# Patient Record
Sex: Female | Born: 1985 | Hispanic: Yes | Marital: Single | Smoking: Former smoker
Health system: Southern US, Community
[De-identification: ages and names within clinical notes are randomized; demographics above are authoritative.]

## PROBLEM LIST (undated history)

## (undated) ENCOUNTER — Inpatient Hospital Stay (HOSPITAL_COMMUNITY): Payer: Self-pay

## (undated) DIAGNOSIS — D219 Benign neoplasm of connective and other soft tissue, unspecified: Secondary | ICD-10-CM

## (undated) DIAGNOSIS — R87629 Unspecified abnormal cytological findings in specimens from vagina: Secondary | ICD-10-CM

## (undated) DIAGNOSIS — N871 Moderate cervical dysplasia: Secondary | ICD-10-CM

## (undated) HISTORY — PX: LEEP: SHX91

## (undated) HISTORY — PX: COLPOSCOPY: SHX161

---

## 2015-05-21 ENCOUNTER — Encounter (HOSPITAL_COMMUNITY): Payer: Self-pay | Admitting: Emergency Medicine

## 2015-05-21 ENCOUNTER — Emergency Department (HOSPITAL_COMMUNITY)
Admission: EM | Admit: 2015-05-21 | Discharge: 2015-05-21 | Disposition: A | Discharge status: Home / Self Care | Payer: Self-pay | Attending: Emergency Medicine | Admitting: Emergency Medicine

## 2015-05-21 DIAGNOSIS — R1013 Epigastric pain: Secondary | ICD-10-CM

## 2015-05-21 DIAGNOSIS — K2970 Gastritis, unspecified, without bleeding: Secondary | ICD-10-CM

## 2015-05-21 MED ORDER — GI COCKTAIL ~~LOC~~
30.0000 mL | Freq: Once | ORAL | Status: AC
  Administered 2015-05-21: 30 mL via ORAL
  Filled 2015-05-21: qty 30

## 2015-05-21 MED ORDER — OMEPRAZOLE 20 MG PO CPDR: 20.0000 mg | DELAYED_RELEASE_CAPSULE | Freq: Every day | ORAL | Status: DC

## 2015-05-21 NOTE — Discharge Instructions (Signed)
Gastritis - Adultos  °(Gastritis, Adult) ° La gastrittis es la irritación (inflamación) de la membrana interna del estómago. Puede ser una enfermedad de inicio súbito (aguda) o de largo plazo (crónica). Si la gastritis no se trata, puede causar sangrado y úlceras. °CAUSAS  °La gastritis se produce cuando la membrana que tapiza interiormente al estómago se debilita o se daña. Los jugos digestivos del estómago inflaman el revestimiento del estómago debilitado. El revestimiento del estómago puede debilitarse o dañarse por una infección viral o bacteriana. La infección bacteriana más común es la infección por Helicobacter pylori. También puede ser el resultado del consumo excesivo de alcohol, por el uso de ciertos medicamentos o porque hay demasiado ácido en el estómago.  °SÍNTOMAS  °En algunos casos no hay síntomas. Si se presentan síntomas, éstos pueden ser:  °· Dolor o sensación de ardor en la parte superior del abdomen. °· Náuseas. °· Vómitos. °· Sensación molesta de distensión después de comer. °DIAGNÓSTICO  °El médico puede diagnosticar gastritis según los síntomas y el examen físico. Para determinar la causa de la gastritis, el médico podrá:  °· Pedir análisis de sangre o de materia fecal para diagnosticar la presencia de la bacteria H pylori. °· Gastroscopía. Un tubo delgado y flexible (endoscopio) se pasa por el esófago hasta llegar al estómago. El endoscopio tiene una luz y una cámara en el extremo. El médico utilizará el endoscopio para observar el interior del estómago. °· Tomará una muestra de tejido (biopsia) del estómago para examinarlo en el microscopio. °TRATAMIENTO  °Según la causa de la gastritis podrán recetarle: Antibióticos, si la causa es una infección bacteriana, como una infección por H. pylori. Antiácidos o bloqueadores H2, si hay demasiado ácido en el estómago. El médico le aconsejará que deje de tomar aspirina, ibuprofeno u otros antiinflamatorios no esteroides (AINE).  °INSTRUCCIONES PARA EL  CUIDADO EN EL HOGAR  °· Tome sólo medicamentos de venta libre o recetados, según las indicaciones del médico. °· Si le han recetado antibióticos, tómelos según las indicaciones. Tómelos todos, aunque se sienta mejor. °· Debe ingerir gran cantidad de líquido para mantener la orina de tono claro o color amarillo pálido. °· Evite las comidas y bebidas que empeoran los problemas, como: °¨ Bebidas con cafeína o alcohólicas. °¨ Chocolate. °¨ Sabores a menta. °¨ Ajo y cebolla. °¨ Comidas muy condimentadas. °¨ Cítricos como naranjas, limones o limas. °¨ Alimentos que contengan tomate, como salsas, chile y pizza. °¨ Alimentos fritos y grasos. °· Haga comidas pequeñas durante el día en lugar de 3 comidas abundantes. °SOLICITE ATENCIÓN MÉDICA DE INMEDIATO SI:  °· La materia fecal es negra o de color rojo oscuro. °· Vomita sangre de color rojo brillante o material similar a granos de café. °· No puede retener los líquidos. °· El dolor abdominal empeora. °· Tiene fiebre. °· No mejora luego de 1 semana. °· Tiene preguntas o preocupaciones. °ASEGÚRESE DE QUE:  °· Comprende estas instrucciones. °· Controlará su enfermedad. °· Solicitará ayuda de inmediato si no mejora o si empeora. °Document Released: 05/27/2005 Document Revised: 05/11/2012 °ExitCare® Patient Information ©2015 ExitCare, LLC. This information is not intended to replace advice given to you by your health care provider. Make sure you discuss any questions you have with your health care provider. ° °

## 2015-05-21 NOTE — ED Notes (Signed)
Pt arrived to the ED with a complaint of abdominal pain. Pain is located in the medical upper abdomen with corresponding pain in her back  Pt pain has been present for a day.

## 2015-05-29 NOTE — ED Provider Notes (Signed)
CSN: [id number]     Arrival date & time 05/21/15  0104 History   First MD Initiated Contact with Patient 05/21/15 0248     Chief Complaint  Patient presents with  . Abdominal Pain     (Consider location/radiation/quality/duration/timing/severity/associated sxs/prior Treatment) Patient is a 29 y.o. female presenting with abdominal pain. The history is provided by the patient. No language interpreter was used.  Abdominal Pain Pain location:  Epigastric Pain quality: sharp   Pain radiates to:  Back Pain severity:  Moderate Onset quality:  Gradual Timing:  Intermittent Progression:  Worsening Associated symptoms: no chest pain, no chills, no fever, no nausea, no shortness of breath and no vomiting   Associated symptoms comment:  Pain in epigastrium since earlier yesterday. She denies any association with having eaten, has no fever, vomiting or change in bowel habits. No cough, chest pain or SOB. She has tried TUMs without relief.    No past medical history on file. No past surgical history on file. No family history on file. Social History  Substance Use Topics  . Smoking status: Never Smoker   . Smokeless tobacco: Not on file  . Alcohol Use: Yes   OB History    No data available     Review of Systems  Constitutional: Negative for fever and chills.  Respiratory: Negative.  Negative for shortness of breath.   Cardiovascular: Negative.  Negative for chest pain.  Gastrointestinal: Positive for abdominal pain. Negative for nausea and vomiting.  Genitourinary: Negative.   Musculoskeletal: Positive for back pain.  Skin: Negative.  Negative for rash.  Neurological: Negative.       Allergies  Review of patient's allergies indicates no known allergies.  Home Medications   Prior to Admission medications   Medication Sig Start Date End Date Taking? Authorizing Provider  ibuprofen (ADVIL,MOTRIN) 200 MG tablet Take 400 mg by mouth every 6 (six) hours as needed (for pain.).    Yes Historical Provider, MD  omeprazole (PRILOSEC) 20 MG capsule Take 1 capsule (20 mg total) by mouth daily. 05/21/15   Shari Upstill, PA-C   BP 128/80 mmHg  Pulse 84  Temp(Src) 98.9 F (37.2 C) (Oral)  Resp 18  SpO2 100%  LMP 05/07/2015 (Approximate) Physical Exam  Constitutional: She is oriented to person, place, and time. She appears well-developed and well-nourished. No distress.  HENT:  Head: Normocephalic.  Pulmonary/Chest: Effort normal. She has no wheezes. She has no rales.  Abdominal: Soft. Bowel sounds are normal. She exhibits no distension. There is tenderness. There is no rebound and no guarding.  Tenderness to epigastric abdomen.   Musculoskeletal: Normal range of motion.  Neurological: She is alert and oriented to person, place, and time.  Skin: Skin is warm and dry.  Psychiatric: She has a normal mood and affect.    ED Course  Procedures (including critical care time) Labs Review Labs Reviewed - No data to display No results found for this or any previous visit.  Imaging Review No results found. I have personally reviewed and evaluated these images and lab results as part of my medical decision-making.   EKG Interpretation None      MDM   Final diagnoses:  Epigastric pain  Gastritis    GI Cocktail provided with complete relief of pain. She has normal VS, no nausea/vomiting. Suspect pain related to gastritis after heavy alcohol use yesterday. Stable for discharge.     Charlann Lange, PA-C 05/29/15 0006  Jola Schmidt, MD 05/31/15 [phone]

## 2017-03-12 ENCOUNTER — Encounter (HOSPITAL_COMMUNITY): Payer: Self-pay | Admitting: *Deleted

## 2017-04-01 ENCOUNTER — Ambulatory Visit (HOSPITAL_COMMUNITY)
Admission: RE | Admit: 2017-04-01 | Discharge: 2017-04-01 | Disposition: A | Discharge status: Home / Self Care | Payer: Self-pay | Source: Ambulatory Visit | Attending: Obstetrics and Gynecology | Admitting: Obstetrics and Gynecology

## 2017-04-01 ENCOUNTER — Encounter (HOSPITAL_COMMUNITY): Payer: Self-pay

## 2017-04-01 VITALS — BP 125/86 | HR 79 | Temp 97.5°F | Ht 67.0 in | Wt 168.4 lb

## 2017-04-01 DIAGNOSIS — Z1239 Encounter for other screening for malignant neoplasm of breast: Secondary | ICD-10-CM

## 2017-04-01 DIAGNOSIS — R87612 Low grade squamous intraepithelial lesion on cytologic smear of cervix (LGSIL): Secondary | ICD-10-CM

## 2017-04-01 NOTE — Patient Instructions (Addendum)
Explained breast self awareness with [name]. Patient did not need a Pap smear today due to last Pap smear was 02/24/2017. Explained the colposcopy the recommended follow up for her abnormal Pap smear. Patient referred to the [facility] [place] [facility] for a colpscopy. Appointment scheduled for Wednesday, April 21, 2017 at 1020. Patient aware of appointment and will be there. Discussed smoking cessation with patient. Referred to the [facility] Quitline. Let patient know she will need a screening mammogram at age 62 unless clinically indicated prior. Tracie Walsh verbalized understanding.  Lucion Dilger, Arvil Chaco, RN 4:34 PM

## 2017-04-01 NOTE — Progress Notes (Signed)
Patient referred to [place] by the [facility] Department due to having an abnormal Pap smear on 02/24/2017 that a colposcopy is recommended for follow up.   Pap Smear: Pap smear not completed today. Last Pap smear was 02/24/2017 at the [facility] and [facility]. Patient referred to the [facility] [place] [facility] for a colpscopy. Appointment scheduled for Wednesday, April 21, 2017 at 1020. Per patient has no history of an abnormal Pap smear prior to the most recent Pap smear. Last Pap smear result is in EPIC.  Physical exam: Breasts Breasts symmetrical. No skin abnormalities bilateral breasts. No nipple retraction bilateral breasts. No nipple discharge bilateral breasts. No lymphadenopathy. No lumps palpated bilateral breasts. No complaints of pain or tenderness on exam. Screening mammogram recommended at age 18 unless clinically indicated prior.       Pelvic/Bimanual No Pap smear completed today since last Pap smear was 02/24/2017. Pap smear not indicated per BCCCP guidelines.  Smoking History: Patient stated she occasionally smokes hookah. Discussed smoking cessation with patient. Referred to the [facility] Quitline.  Patient Navigation: Patient education provided. Access to services provided for patient through [facility] program. Spanish interpreter provided.  Used Spanish interpreter [employer] from [place].

## 2017-04-01 NOTE — Addendum Note (Signed)
Encounter addended by: Loletta Parish, RN on: 04/01/2017  4:41 PM<BR>    Actions taken: Sign clinical note

## 2017-04-21 ENCOUNTER — Ambulatory Visit (INDEPENDENT_AMBULATORY_CARE_PROVIDER_SITE_OTHER): Payer: Self-pay | Admitting: Obstetrics & Gynecology

## 2017-04-21 ENCOUNTER — Encounter: Payer: Self-pay | Admitting: Obstetrics & Gynecology

## 2017-04-21 ENCOUNTER — Other Ambulatory Visit (HOSPITAL_COMMUNITY): Admission: RE | Admit: 2017-04-21 | Payer: Self-pay | Source: Ambulatory Visit | Admitting: Obstetrics & Gynecology

## 2017-04-21 ENCOUNTER — Other Ambulatory Visit (HOSPITAL_COMMUNITY)
Admission: RE | Admit: 2017-04-21 | Discharge: 2017-04-21 | Disposition: A | Discharge status: Home / Self Care | Payer: Self-pay | Source: Ambulatory Visit | Attending: Obstetrics & Gynecology | Admitting: Obstetrics & Gynecology

## 2017-04-21 VITALS — BP 123/82 | HR 86 | Ht 67.0 in | Wt 167.9 lb

## 2017-04-21 DIAGNOSIS — R87612 Low grade squamous intraepithelial lesion on cytologic smear of cervix (LGSIL): Secondary | ICD-10-CM

## 2017-04-21 DIAGNOSIS — N871 Moderate cervical dysplasia: Secondary | ICD-10-CM

## 2017-04-21 DIAGNOSIS — Z3202 Encounter for pregnancy test, result negative: Secondary | ICD-10-CM

## 2017-04-21 LAB — POCT PREGNANCY, URINE: PREG TEST UR: NEGATIVE

## 2017-04-21 NOTE — Patient Instructions (Signed)
Colposcopía - Cuidados posteriores  (Colposcopy, Care After)  Siga estas instrucciones durante las próximas semanas. Estas indicaciones le proporcionan información general acerca de cómo deberá cuidarse después del procedimiento. El médico también podrá darle instrucciones más específicas. El tratamiento se ha planificado de acuerdo a las prácticas médicas actuales, pero a veces se producen problemas. Comuníquese con el médico si tiene algún problema o tiene dudas después del procedimiento.  QUÉ ESPERAR DESPUÉS DEL PROCEDIMIENTO   Después del procedimiento, es típico tener las siguientes sensaciones:  · Cólicos. Generalmente se calman en algunos minutos.  · Dolor. Puede durar hasta dos días.  · Aturdimiento. Si esto le ocurre, recuéstese durante algunos minutos.  Podrá tener un sangrado leve o una secreción oscura que debe detenerse en algunos días. Durante este tiempo deberá usar un apósito sanitario.  INSTRUCCIONES PARA EL CUIDADO EN EL HOGAR  · Evite las relaciones sexuales, las duchas vaginales y el uso de tampones durante 3 días, o según lo que le indique su médico.  · Tome sólo medicamentos de venta libre o recetados, según las indicaciones del médico. No tome aspirina, ya que puede causar hemorragias.  · Si utiliza píldoras anticonceptivas, continúe tomándolas.  · No todos los resultados estarán disponibles durante su visita. En este caso, tenga otra entrevista con su médico para conocerlos. No suponga que es normal si no tiene noticias de su médico o del establecimiento de salud. Es importante el seguimiento de todos los resultados de los estudios.  · Siga los consejos de su médico con respecto a los medicamentos, actividades, visitas y [place] de control.  SOLICITE ATENCIÓN MÉDICA SI:  · Aparece una erupción cutánea.  · Tiene problemas con los medicamentos.  SOLICITE ATENCIÓN MÉDICA DE INMEDIATO SI:  · Tiene una hemorragia abundante o elimina coágulos.  · Tiene fiebre.  · Tiene flujo vaginal  anormal.  · Tiene cólicos que no se alivian luego de tomar analgésicos.  · Se siente mareada, tiene vahídos o se desmaya.  · Siente dolor en el estómago.  Esta información no tiene como fin reemplazar el consejo del médico. Asegúrese de hacerle al médico cualquier pregunta que tenga.  Document Released: 06/07/2013  Elsevier Interactive Patient Education © 2017 Elsevier Inc.

## 2017-04-21 NOTE — Progress Notes (Signed)
States had unprotected intercourse 2 days ago, but husband did not ejaculate.

## 2017-05-04 ENCOUNTER — Telehealth: Payer: Self-pay | Admitting: General Practice

## 2017-05-04 NOTE — Telephone Encounter (Signed)
Called patient with pacific interpreter [phone], no answer- left message to call us back for results. Message sent to front office to schedule.

## 2017-05-04 NOTE — Telephone Encounter (Signed)
-----   Message from Woodroe Mode, MD sent at 04/26/2017  6:32 PM EDT ----- CIN 2 please schedule colposcopy

## 2017-05-05 NOTE — Telephone Encounter (Signed)
Called patient with pacific interpreter [phone] & informed her of results, need for LEEP, and that front office will contact her with LEEP appt. Patient verbalized understanding and asked if it will still be covered by BCCCP. Told patient yes they will. Patient verbalized understanding & had no other questions

## 2017-06-10 ENCOUNTER — Encounter: Payer: Self-pay | Admitting: Obstetrics & Gynecology

## 2017-06-10 ENCOUNTER — Ambulatory Visit (INDEPENDENT_AMBULATORY_CARE_PROVIDER_SITE_OTHER): Payer: Self-pay | Admitting: Obstetrics & Gynecology

## 2017-06-10 ENCOUNTER — Other Ambulatory Visit (HOSPITAL_COMMUNITY)
Admission: RE | Admit: 2017-06-10 | Discharge: 2017-06-10 | Disposition: A | Discharge status: Home / Self Care | Payer: Self-pay | Source: Ambulatory Visit | Attending: Obstetrics & Gynecology | Admitting: Obstetrics & Gynecology

## 2017-06-10 DIAGNOSIS — N871 Moderate cervical dysplasia: Secondary | ICD-10-CM

## 2017-06-10 HISTORY — DX: Moderate cervical dysplasia: N87.1

## 2017-06-10 LAB — POCT PREGNANCY, URINE: PREG TEST UR: NEGATIVE

## 2017-06-10 NOTE — Patient Instructions (Signed)
Conizacin del cuello del tero, cuidados posteriores (Conization of the Cervix, Care After) Siga estas instrucciones durante las prximas semanas. Estas indicaciones le proporcionan informacin acerca de cmo deber cuidarse despus del procedimiento. El mdico tambin podr darle instrucciones ms especficas. El tratamiento se ha planificado de acuerdo a las prcticas mdicas actuales, pero a veces se producen problemas. Comunquese con el mdico si tiene algn problema o tiene dudas despus del procedimiento. QU ESPERAR DESPUS DEL PROCEDIMIENTO Despus del procedimiento, es tpico tener las siguientes sensaciones:  Si le han administrado anestesia general se sentir mareada durante 2 o 3 horas despus del procedimiento.  Podr sentir clicos (similar a los Stage manager) durante aproximadamente 1 semana.  Puede tener sangrado o hemorragia vaginal leves o moderados durante 1 o 2semanas. El sangrado no debe ser abundante (por ejemplo, no debe empapar un apsito en menos de 1 hora).  Podr tener una secrecin vaginal oscura, similar a la borra del caf. Es la pasta que le han aplicado en el cuello del tero para controlar el sangrado. Esto es normal. La recuperacin puede demorar hasta 3 semanas. INSTRUCCIONES PARA EL CUIDADO EN EL HOGAR  Pdale a alguna persona que la lleve a su casa luego del procedimiento.  Solo tome los [employer] indic el mdico. No tome aspirina. Puede ocasionar hemorragias.  Durante la primera semana tome duchas. No tome baos, no practique natacin ni use el jacuzzi hasta que el mdico la autorice.  No se haga duchas vaginales, no utilice tampones ni tenga relaciones sexuales hasta que el mdico la autorice.  Evite las actividades extenuantes y la prctica de ejercicios durante al menos 7 a 14 das.  Podr volver a su dieta normal, excepto que el mdico le indique otra cosa.  Si est estreida, podr: ? Tomar un laxante suave segn las  indicaciones del mdico. ? Agregar frutas y salvado a su dieta. ? Debe ingerir gran cantidad de lquido para mantener la orina de tono claro o color amarillo plido.  Cumpla con todas las visitas de control con su mdico.  SOLICITE ATENCIN MDICA SI:  Le aparece una erupcin cutnea.  Se siente mareada o sufre un desmayo.  Siente nuseas.  Tiene una secrecin vaginal con mal olor.  SOLICITE ATENCIN MDICA DE INMEDIATO SI:  Observa cogulos sanguneos o una hemorragia ms abundante que un perodo menstrual normal (por ejemplo, si empapa un apsito en menos de 1 hora) o si la hemorragia es de color rojo brillante.  Tiene fiebre de ms de 101F (38,3C) o sntomas persistentes durante ms de 2 o 3das.  Tiene fiebre de ms de 101F (38,3C) y los sntomas empeoran repentinamente.  Siente cada vez ms clicos.  Se desmaya.  Siente dolor al [employer].  La orina tiene [place].  Comienza a vomitar.  El dolor no se alivia con los [employer].  El dolor es intenso o [place].  ASEGRESE DE QUE:  Comprende estas instrucciones.  Controlar su afeccin.  Recibir ayuda de inmediato si no mejora o si empeora.  Esta informacin no tiene Marine scientist el consejo del mdico. Asegrese de hacerle al mdico cualquier pregunta que tenga. Document Released: 04/19/2013 Document Revised: 08/22/2013 Document Reviewed: 02/10/2013 Elsevier Interactive Patient Education  2017 [employer].

## 2017-06-10 NOTE — Progress Notes (Signed)
Patient identified, informed consent obtained, signed copy in chart, time out performed.  Pap smear and colposcopy reviewed.   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  Cervix visualized. Paracervical block placed.  small size Fischer loop used to remove cone of cervix using blend of cut and cautery on LEEP machine.  Edges/Base cauterized with Ball.  Monsel's solution used for hemostasis.  Patient tolerated procedure well.  Patient given post procedure instructions.  Follow up in 12 months for repeat pap or as needed.  Woodroe Mode, MD 06/10/2017

## 2017-10-04 ENCOUNTER — Encounter (HOSPITAL_COMMUNITY): Payer: Self-pay

## 2017-10-04 ENCOUNTER — Encounter (HOSPITAL_COMMUNITY): Payer: Self-pay | Admitting: *Deleted

## 2017-10-04 LAB — OB RESULTS CONSOLE GC/CHLAMYDIA
Chlamydia: NEGATIVE
Gonorrhea: NEGATIVE

## 2017-10-04 LAB — OB RESULTS CONSOLE HEPATITIS B SURFACE ANTIGEN: Hepatitis B Surface Ag: NEGATIVE

## 2017-10-04 LAB — OB RESULTS CONSOLE ABO/RH: RH Type: POSITIVE

## 2017-10-04 LAB — OB RESULTS CONSOLE ANTIBODY SCREEN: Antibody Screen: NEGATIVE

## 2017-10-04 LAB — OB RESULTS CONSOLE RPR: RPR: NONREACTIVE

## 2017-10-04 LAB — OB RESULTS CONSOLE HIV ANTIBODY (ROUTINE TESTING): HIV: NONREACTIVE

## 2017-10-04 LAB — OB RESULTS CONSOLE RUBELLA ANTIBODY, IGM: RUBELLA: IMMUNE

## 2017-10-06 ENCOUNTER — Ambulatory Visit (HOSPITAL_COMMUNITY): Payer: Self-pay | Attending: Nurse Practitioner

## 2017-10-06 ENCOUNTER — Encounter (HOSPITAL_COMMUNITY): Payer: Self-pay

## 2017-10-08 ENCOUNTER — Inpatient Hospital Stay (HOSPITAL_COMMUNITY)
Admission: AD | Admit: 2017-10-08 | Discharge: 2017-10-08 | Disposition: A | Discharge status: Home / Self Care | Payer: Self-pay | Source: Ambulatory Visit | Attending: Obstetrics & Gynecology | Admitting: Obstetrics & Gynecology

## 2017-10-08 ENCOUNTER — Encounter (HOSPITAL_COMMUNITY): Payer: Self-pay | Admitting: *Deleted

## 2017-10-08 DIAGNOSIS — D259 Leiomyoma of uterus, unspecified: Secondary | ICD-10-CM

## 2017-10-08 DIAGNOSIS — O98812 Other maternal infectious and parasitic diseases complicating pregnancy, second trimester: Secondary | ICD-10-CM | POA: Insufficient documentation

## 2017-10-08 DIAGNOSIS — R109 Unspecified abdominal pain: Secondary | ICD-10-CM | POA: Insufficient documentation

## 2017-10-08 DIAGNOSIS — Z3A14 14 weeks gestation of pregnancy: Secondary | ICD-10-CM | POA: Insufficient documentation

## 2017-10-08 DIAGNOSIS — O26899 Other specified pregnancy related conditions, unspecified trimester: Secondary | ICD-10-CM

## 2017-10-08 DIAGNOSIS — B373 Candidiasis of vulva and vagina: Secondary | ICD-10-CM | POA: Insufficient documentation

## 2017-10-08 DIAGNOSIS — O26892 Other specified pregnancy related conditions, second trimester: Secondary | ICD-10-CM | POA: Insufficient documentation

## 2017-10-08 DIAGNOSIS — O98819 Other maternal infectious and parasitic diseases complicating pregnancy, unspecified trimester: Secondary | ICD-10-CM

## 2017-10-08 DIAGNOSIS — B3731 Acute candidiasis of vulva and vagina: Secondary | ICD-10-CM

## 2017-10-08 DIAGNOSIS — O3412 Maternal care for benign tumor of corpus uteri, second trimester: Secondary | ICD-10-CM | POA: Insufficient documentation

## 2017-10-08 LAB — URINALYSIS, ROUTINE W REFLEX MICROSCOPIC
BILIRUBIN URINE: NEGATIVE
Glucose, UA: NEGATIVE mg/dL
HGB URINE DIPSTICK: NEGATIVE
KETONES UR: NEGATIVE mg/dL
NITRITE: NEGATIVE
PROTEIN: NEGATIVE mg/dL
Specific Gravity, Urine: 1.012 (ref 1.005–1.030)
pH: 7 (ref 5.0–8.0)

## 2017-10-08 LAB — CBC WITH DIFFERENTIAL/PLATELET
BASOS ABS: 0 10*3/uL (ref 0.0–0.1)
Basophils Relative: 0 %
Eosinophils Absolute: 0.2 10*3/uL (ref 0.0–0.7)
Eosinophils Relative: 2 %
HEMATOCRIT: 36 % (ref 36.0–46.0)
Hemoglobin: 12.3 g/dL (ref 12.0–15.0)
LYMPHS ABS: 1.7 10*3/uL (ref 0.7–4.0)
LYMPHS PCT: 15 %
MCH: 31.1 pg (ref 26.0–34.0)
MCHC: 34.2 g/dL (ref 30.0–36.0)
MCV: 91.1 fL (ref 78.0–100.0)
MONOS PCT: 8 %
Monocytes Absolute: 0.8 10*3/uL (ref 0.1–1.0)
NEUTROS ABS: 8.2 10*3/uL — AB (ref 1.7–7.7)
Neutrophils Relative %: 75 %
Platelets: 272 10*3/uL (ref 150–400)
RBC: 3.95 MIL/uL (ref 3.87–5.11)
RDW: 11.7 % (ref 11.5–15.5)
WBC: 10.8 10*3/uL — ABNORMAL HIGH (ref 4.0–10.5)

## 2017-10-08 LAB — WET PREP, GENITAL
Clue Cells Wet Prep HPF POC: NONE SEEN
Sperm: NONE SEEN
Trich, Wet Prep: NONE SEEN

## 2017-10-08 MED ORDER — CYCLOBENZAPRINE HCL 5 MG PO TABS: 5.0000 mg | ORAL_TABLET | Freq: Three times a day (TID) | ORAL | 0 refills | Status: AC | PRN

## 2017-10-08 MED ORDER — OXYCODONE-ACETAMINOPHEN 5-325 MG PO TABS
1.0000 | ORAL_TABLET | Freq: Once | ORAL | Status: AC
  Administered 2017-10-08: 1 via ORAL
  Filled 2017-10-08: qty 1

## 2017-10-08 MED ORDER — GLYCOPYRROLATE 1 MG PO TABS: 1.0000 mg | ORAL_TABLET | Freq: Three times a day (TID) | ORAL | 0 refills | Status: DC

## 2017-10-08 MED ORDER — TERCONAZOLE 0.4 % VA CREA: 1.0000 | TOPICAL_CREAM | Freq: Every day | VAGINAL | 0 refills | Status: DC

## 2017-10-08 NOTE — Discharge Instructions (Signed)
Las medicinas seguras para tomar Solicitor  Safe Medications in Pregnancy  Acn:  Benzoyl Peroxide (Perxido de benzolo)  Salicylic Acid (cido saliclico)  Dolor de espalda/Dolor de cabeza:  Tylenol: 2 pastillas de concentracin regular cada 4 horas O 2 pastillas de concentracin fuerte cada 6 horas  Resfriados/Tos/Alergias:  Benadryl (sin alcohol) 25 mg cada 6 horas segn lo necesite Breath Right strips (Tiras para respirar correctamente)  Claritin  Cepacol (pastillas de chupar para la garganta)  Chloraseptic (aerosol para la garganta)  Cold-Eeze- hasta tres veces por da  Cough drops (pastillas de chupar para la tos, sin alcohol)  Flonase (con receta mdica solamente)  Guaifenesin  Mucinex  Robitussin DM (simple solamente, sin alcohol)  Saline nasal spray/drops (Aerosol nasal salino/gotas) Sudafed (pseudoephedrine) y  Actifed * utilizar slo despus de 12 semanas de gestacin y si no tiene la presin arterial alta.  Tylenol Vicks  VapoRub  Zinc lozenges (pastillas para la garganta)  Zyrtec  Estreimiento:  Colace  Ducolax (supositorios)  Fleet enema (lavado intestinal rectal)  Glycerin (supositorios)  Metamucil  Milk of magnesia (leche de magnesia)  Miralax  Senokot  Smooth Move (t)  Diarrea:  Kaopectate Imodium A-D  *NO tome Pepto-Bismol  Hemorroides:  Anusol  Anusol HC  Preparation H  Tucks  Indigestin:  Tums  Maalox  Mylanta  Zantac  Pepcid  Insomnia:  Benadryl (sin alcohol) 25mg  cada 6 horas segn lo necesite  Tylenol PM  Unisom, no Gelcaps  [place] en las piernas:  Tums  MagGel Nuseas/Vmitos:  Bonine  Dramamine  Emetrol  Ginger (extracto)  Sea-Bands  Meclizine  Medicina para las nuseas que puede tomar durante el embarazo: Unisom (doxylamine succinate, pastillas de 25 mg) Tome una pastilla al da al [place]. Si los sntomas no estn adecuadamente controlados, la dosis puede aumentarse hasta una dosis mxima recomendada de [employer] al da (1/2 pastilla por la [place], 1/2 pastilla a media tarde y Ardelia Mems pastilla al [place]). Pastillas de Vitamina B6 de 100mg . Tome [employer] veces al da (hasta 200 mg por da).  Erupciones en la piel:  Productos de Aveeno  Benadryl cream (crema o una dosis de 25mg  cada 6 horas segn lo necesite)  Calamine Lotion (locin)  1% cortisone cream (crema de cortisona de 1%)  nfeccin vaginal por hongos (candidiasis):  Gyne-lotrimin 7  Monistat 7   **Si est tomando varias medicinas, por favor revise las etiquetas para Conservation officer, nature los mismos ingredientes [place]. **Tome la medicina segn lo indicado en la etiqueta. **No tome ms de 400 mg de Tylenol en 24 horas. **No tome medicinas que contengan aspirina o ibuprofeno.       Fibromas uterinos (Uterine Fibroids) Los fibromas uterinos son masas (tumores) de tejido que pueden desarrollarse en el vientre (tero). Tambin se los [employer]. Este tipo de tumor no es Radio broadcast assistant (benigno) y no se disemina a Airline pilot del cuerpo fuera de la zona plvica, la cual se encuentra entre los huesos de la cadera. En ocasiones, los fibromas pueden crecer en las trompas de [place], en el cuello del tero o en las estructuras de soporte (ligamentos) que rodean el tero. Una mujer puede tener uno o ms fibromas. Los fibromas pueden tener diferente tamao y [place], y crecer en distintas partes del tero. Algunos pueden crecer hasta volverse bastante grandes. La mayora no requiere tratamiento mdico. CAUSAS Un fibroma puede desarrollarse cuando una nica clula uterina contina creciendo (se multiplica). La mayora de las clulas del cuerpo [place]  tienen un mecanismo de control que impide que se multipliquen sin control. [place] los sntomas se pueden incluir los siguientes:  Hemorragias intensas durante la menstruacin.  Prdidas de sangre o [employer].  Dolor y opresin en la pelvis.  Problemas  de la vejiga, como necesidad de Garment/textile technologist con ms frecuencia (polaquiuria) o necesidad imperiosa de Garment/textile technologist.  Incapacidad para reproducir (infertilidad).  Abortos espontneos. DIAGNSTICO Los fibromas uterinos se diagnostican con un examen fsico. El mdico puede palpar los tumores grumosos durante un examen plvico. Pueden realizarse ecografas y Ardelia Mems resonancia magntica para determinar el tamao y la ubicacin de los fibromas, as como la cantidad. TRATAMIENTO El tratamiento puede incluir lo siguiente:  Observacin cautelosa. Esto requiere que el mdico controle el fibroma para saber si crece o se achica. Siga las recomendaciones del mdico respecto de la frecuencia con la que debe realizarse los controles.  Medicamentos hormonales. Pueden tomarse por va oral o administrarse a travs de un dispositivo intrauterino (DIU).  [place]. ? Extirpacin de los fibromas (miomectoma) o del tero (histerectoma). ? Suprimir la irrigacin sangunea a los fibromas (embolizacin de la arteria uterina). Si los fibromas le traen problemas de fertilidad y tiene deseos de quedar [place], el mdico puede recomendar su extirpacin. INSTRUCCIONES PARA EL CUIDADO EN EL HOGAR  Concurra a todas las visitas de control como se lo haya indicado el mdico. Esto es importante.  Tome los medicamentos de venta libre y los recetados solamente como se lo haya indicado el mdico. ? Si le recetaron un tratamiento hormonal, tome los medicamentos hormonales exactamente como se lo indicaron.  Consulte al [employer] sobre tomar comprimidos de hierro y Garment/textile technologist la cantidad de verduras de hoja color verde oscuro en la dieta. Estas medidas pueden ayudar a Transport planner de hierro en la [place], que pueden verse afectados por las hemorragias menstruales intensas.  Preste mucha atencin a la menstruacin e informe al mdico si hay algn cambio, por ejemplo: ? Aumento del flujo de sangre que le exige el uso de ms compresas o  tampones que los que utiliza normalmente cada mes. ? Un cambio en la cantidad de [employer] dura la menstruacin cada mes. ? Un cambio en los sntomas asociados con la [place], como clicos abdominales o dolor de espalda. SOLICITE ATENCIN MDICA SI:  Tiene dolor plvico, dolor de espalda o clicos abdominales que los medicamentos no Engineer, petroleum.  Observa un aumento del sangrado entre y [employer].  Empapa los tampones o las compresas en el trmino de media hora o [place].  Se siente mareada, muy cansada o dbil. SOLICITE ATENCIN MDICA DE INMEDIATO SI:  Se desmaya.  El dolor plvico aumenta repentinamente. Esta informacin no tiene Marine scientist el consejo del mdico. Asegrese de hacerle al mdico cualquier pregunta que tenga. Document Released: 08/17/2005 Document Revised: 12/09/2015 Document Reviewed: 02/13/2014 Elsevier Interactive Patient Education  [employer].

## 2017-10-08 NOTE — MAU Note (Addendum)
PT SAYS WITH INTERPRETER- JANET-    [place]   AT HD .   STARTED HAVING LOWER ABD PAIN YESTERDAY .- HURTS TO WALK.       LAST SEX-  LAST  WED.  NO MEDS FOR PAIN

## 2017-10-08 NOTE — MAU Provider Note (Signed)
History     CSN: [id number]  Arrival date and time: 10/08/17 9509   First Provider Initiated Contact with Patient 10/08/17 0125     Chief Complaint  Patient presents with  . Abdominal Pain   HPI Tracie Walsh is a 32 y.o. G2P1001 at [redacted]w[redacted]d who presents with abdominal pain. She states the pain started at 2000 on 2/6 that got worse in the morning. She states the pain continued to get worse throughout the day. She rates the pain an 8/10 and has not tried anything for the pain. She states she had an ultrasound on Monday that showed 3 fibroids in the top of her uterus. She denies any leaking or bleeding. She gets prenatal care at [facility].   OB History    Gravida Para Term Preterm AB Living   2 1 1     1    SAB TAB Ectopic Multiple Live Births           1      History reviewed. No pertinent past medical history.  Past Surgical History:  Procedure Laterality Date  . CESAREAN SECTION    . LEEPS      Family History  Problem Relation Age of Onset  . Heart disease Father   . Hypercholesterolemia Father     Social History   Tobacco Use  . Smoking status: Current Some Day Smoker  . Smokeless tobacco: Never Used  . Tobacco comment: smokes hookah occasionally  Substance Use Topics  . Alcohol use: Yes    Comment: socially   . Drug use: No    Allergies: No Known Allergies  Medications Prior to Admission  Medication Sig Dispense Refill Last Dose  . Prenatal Vit-Fe Fumarate-FA (PRENATAL MULTIVITAMIN) TABS tablet Take 1 tablet by mouth daily at 12 noon.   10/07/2017 at Unknown time  . ibuprofen (ADVIL,MOTRIN) 200 MG tablet Take 400 mg by mouth every 6 (six) hours as needed (for pain.).   Not Taking    Review of Systems  Constitutional: Negative.  Negative for fatigue and fever.  HENT: Negative.   Respiratory: Negative.  Negative for shortness of breath.   Cardiovascular: Negative.  Negative for chest pain.  Gastrointestinal: Positive for abdominal pain. Negative for  constipation, diarrhea, nausea and vomiting.  Genitourinary: Negative.  Negative for dysuria, vaginal bleeding and vaginal discharge.  Neurological: Negative.  Negative for dizziness and headaches.   Physical Exam   Blood pressure 104/70, pulse (!) 109, temperature 98 F (36.7 C), temperature source Oral, resp. rate 18, height 5\' 7"  (1.702 m), weight 172 lb 8 oz (78.2 kg), last menstrual period 06/29/2017.  Physical Exam  Nursing note and vitals reviewed. Constitutional: She is oriented to person, place, and time. She appears well-developed and well-nourished. No distress.  HENT:  Head: Normocephalic.  Eyes: Pupils are equal, round, and reactive to light.  Cardiovascular: Normal rate, regular rhythm and normal heart sounds.  Respiratory: Effort normal and breath sounds normal. No respiratory distress.  GI: Soft. Bowel sounds are normal. She exhibits distension and mass. There is tenderness in the right lower quadrant, suprapubic area and left lower quadrant. There is guarding. There is no rebound.    Diffuse tenderness along lower abdomen   Genitourinary: Vaginal discharge (thick white adherent discharge ) found.  Neurological: She is alert and oriented to person, place, and time.  Skin: Skin is warm and dry.  Psychiatric: She has a normal mood and affect. Her behavior is normal. Judgment and thought content normal.  Pelvic exam: Cervix pink, visually closed, without lesion, scant white creamy discharge, vaginal walls and external genitalia normal Bimanual exam: Cervix 0/long/high, firm, anterior, neg CMT, uterus non tender  FHT: 159 bpm  MAU Course  Procedures Results for orders placed or performed during the hospital encounter of 10/08/17 (from the past 24 hour(s))  Urinalysis, Routine w reflex microscopic     Status: Abnormal   Collection Time: 10/08/17 12:54 AM  Result Value Ref Range   Color, Urine STRAW (A) YELLOW   APPearance CLEAR CLEAR   Specific Gravity, Urine 1.012  1.005 - 1.030   pH 7.0 5.0 - 8.0   Glucose, UA NEGATIVE NEGATIVE mg/dL   Hgb urine dipstick NEGATIVE NEGATIVE   Bilirubin Urine NEGATIVE NEGATIVE   Ketones, ur NEGATIVE NEGATIVE mg/dL   Protein, ur NEGATIVE NEGATIVE mg/dL   Nitrite NEGATIVE NEGATIVE   Leukocytes, UA MODERATE (A) NEGATIVE   RBC / HPF 0-5 0 - 5 RBC/hpf   WBC, UA 0-5 0 - 5 WBC/hpf   Bacteria, UA RARE (A) NONE SEEN   Squamous Epithelial / LPF 0-5 (A) NONE SEEN   Mucus PRESENT   Wet prep, genital     Status: Abnormal   Collection Time: 10/08/17  1:18 AM  Result Value Ref Range   Yeast Wet Prep HPF POC PRESENT (A) NONE SEEN   Trich, Wet Prep NONE SEEN NONE SEEN   Clue Cells Wet Prep HPF POC NONE SEEN NONE SEEN   WBC, Wet Prep HPF POC MODERATE (A) NONE SEEN   Sperm NONE SEEN   CBC with Differential/Platelet     Status: Abnormal   Collection Time: 10/08/17  2:08 AM  Result Value Ref Range   WBC 10.8 (H) 4.0 - 10.5 K/uL   RBC 3.95 3.87 - 5.11 MIL/uL   Hemoglobin 12.3 12.0 - 15.0 g/dL   HCT 36.0 36.0 - 46.0 %   MCV 91.1 78.0 - 100.0 fL   MCH 31.1 26.0 - 34.0 pg   MCHC 34.2 30.0 - 36.0 g/dL   RDW 11.7 11.5 - 15.5 %   Platelets 272 150 - 400 K/uL   Neutrophils Relative % 75 %   Neutro Abs 8.2 (H) 1.7 - 7.7 K/uL   Lymphocytes Relative 15 %   Lymphs Abs 1.7 0.7 - 4.0 K/uL   Monocytes Relative 8 %   Monocytes Absolute 0.8 0.1 - 1.0 K/uL   Eosinophils Relative 2 %   Eosinophils Absolute 0.2 0.0 - 0.7 K/uL   Basophils Relative 0 %   Basophils Absolute 0.0 0.0 - 0.1 K/uL     MDM UA, urine culture Wet prep and gc/chlamydia Percocet PO- patient reports relief from pain CBC with Diff  Low suspicion for appendicitis due to location of pain and absence of fever, leukocytosis and GI complaints.  Tenderness in abdomen resolved after pain medication  Assessment and Plan   1. Uterine fibroids affecting pregnancy in second trimester   2. Candidiasis of vagina during pregnancy   3. [redacted] weeks gestation of pregnancy   4.  Abdominal pain affecting pregnancy    -Discharge home in stable condition -Rx for terazol and flexeril given to patient -Round ligament pain precautions discussed -Encouraged patient to use maternity support belt -Patient advised to follow-up with [facility] as scheduled on Monday for prenatal care -Patient may return to MAU as needed or if her condition were to change or worsen  Wende Mott CNM 10/08/2017, 1:27 AM

## 2017-10-09 LAB — CULTURE, OB URINE: CULTURE: NO GROWTH

## 2017-10-11 LAB — GC/CHLAMYDIA PROBE AMP (~~LOC~~) NOT AT ARMC
CHLAMYDIA, DNA PROBE: NEGATIVE
NEISSERIA GONORRHEA: NEGATIVE

## 2017-11-03 ENCOUNTER — Inpatient Hospital Stay (HOSPITAL_COMMUNITY)
Admission: AD | Admit: 2017-11-03 | Discharge: 2017-11-04 | Disposition: A | Discharge status: Home / Self Care | Payer: Self-pay | Source: Ambulatory Visit | Attending: Obstetrics & Gynecology | Admitting: Obstetrics & Gynecology

## 2017-11-03 ENCOUNTER — Encounter (HOSPITAL_COMMUNITY): Payer: Self-pay

## 2017-11-03 DIAGNOSIS — D259 Leiomyoma of uterus, unspecified: Secondary | ICD-10-CM | POA: Insufficient documentation

## 2017-11-03 DIAGNOSIS — O3412 Maternal care for benign tumor of corpus uteri, second trimester: Secondary | ICD-10-CM | POA: Insufficient documentation

## 2017-11-03 DIAGNOSIS — O26899 Other specified pregnancy related conditions, unspecified trimester: Secondary | ICD-10-CM

## 2017-11-03 DIAGNOSIS — F17210 Nicotine dependence, cigarettes, uncomplicated: Secondary | ICD-10-CM | POA: Insufficient documentation

## 2017-11-03 DIAGNOSIS — O99332 Smoking (tobacco) complicating pregnancy, second trimester: Secondary | ICD-10-CM | POA: Insufficient documentation

## 2017-11-03 DIAGNOSIS — Z3A18 18 weeks gestation of pregnancy: Secondary | ICD-10-CM | POA: Insufficient documentation

## 2017-11-03 DIAGNOSIS — O2342 Unspecified infection of urinary tract in pregnancy, second trimester: Secondary | ICD-10-CM | POA: Insufficient documentation

## 2017-11-03 DIAGNOSIS — R102 Pelvic and perineal pain: Secondary | ICD-10-CM | POA: Insufficient documentation

## 2017-11-03 DIAGNOSIS — O26892 Other specified pregnancy related conditions, second trimester: Secondary | ICD-10-CM | POA: Insufficient documentation

## 2017-11-03 LAB — URINALYSIS, ROUTINE W REFLEX MICROSCOPIC
Bilirubin Urine: NEGATIVE
GLUCOSE, UA: NEGATIVE mg/dL
Hgb urine dipstick: NEGATIVE
KETONES UR: NEGATIVE mg/dL
Nitrite: NEGATIVE
PROTEIN: NEGATIVE mg/dL
Specific Gravity, Urine: 1.023 (ref 1.005–1.030)
pH: 6 (ref 5.0–8.0)

## 2017-11-03 MED ORDER — SULFAMETHOXAZOLE-TRIMETHOPRIM 800-160 MG PO TABS: 1.0000 | ORAL_TABLET | Freq: Two times a day (BID) | ORAL | 0 refills | Status: AC

## 2017-11-03 MED ORDER — OXYCODONE-ACETAMINOPHEN 5-325 MG PO TABS
1.0000 | ORAL_TABLET | Freq: Once | ORAL | Status: AC
  Administered 2017-11-03: 1 via ORAL
  Filled 2017-11-03: qty 1

## 2017-11-03 MED ORDER — OXYCODONE-ACETAMINOPHEN 5-325 MG PO TABS: 1.0000 | ORAL_TABLET | Freq: Four times a day (QID) | ORAL | 0 refills | Status: AC | PRN

## 2017-11-03 MED ORDER — SULFAMETHOXAZOLE-TRIMETHOPRIM 800-160 MG PO TABS
1.0000 | ORAL_TABLET | Freq: Two times a day (BID) | ORAL | Status: DC
  Administered 2017-11-03: 1 via ORAL
  Filled 2017-11-03: qty 1

## 2017-11-03 NOTE — Discharge Instructions (Signed)
°  Try a pregnancy support belt for your pain.

## 2017-11-03 NOTE — MAU Note (Signed)
Pt reports generalized abdominal pain that started yesterday. States it hurts more on right side. Pt states the pain is sharp and constant. States she has fibroids. She took tylenol pain and "pill for muscle pain" last night, but it did not help. States it hurts to walk and get in bed. States when she is hunched over it feels a little better but standing straight up hurts. Pt denies vaginal bleeding or discharge.

## 2017-11-03 NOTE — MAU Provider Note (Signed)
Chief Complaint: Abdominal Pain   First Provider Initiated Contact with Patient 11/03/17 2132      SUBJECTIVE HPI: Tracie Walsh is a 32 y.o. G2P1001 at [redacted]w[redacted]d by LMP who presents to maternity admissions reporting lower and mid abdominal pain that is constant, but increases with movement. The pain started 2 weeks ago but is much worse in the last 2 days. The pain is bilateral in her low abdomen and around the umbilical area. It is worse when getting out of bed or walking. It is better when leaning forward. She has associated back pain but thinks this is caused by walking leaned over to help her abdominal pain. She has tried Flexeril, Tylenol, and hot showers which have not helped.  She has known fibroids and wants to know if fibroids can cause this pain.  She was diagnosed with yeast infection 1 month ago but is unsure of how to use the Terazol 7 medication that was prescribed. She denies vaginal bleeding, vaginal itching/burning, urinary symptoms, h/a, dizziness, n/v, or fever/chills.    Hospital spanish interpreter used for all communication  HPI  History reviewed. No pertinent past medical history. Past Surgical History:  Procedure Laterality Date  . CESAREAN SECTION    . LEEPS     Social History   Socioeconomic History  . Marital status: Single    Spouse name: Not on file  . Number of children: Not on file  . Years of education: Not on file  . Highest education level: Not on file  Social Needs  . Financial resource strain: Not on file  . Food insecurity - worry: Not on file  . Food insecurity - inability: Not on file  . Transportation needs - medical: Not on file  . Transportation needs - non-medical: Not on file  Occupational History  . Not on file  Tobacco Use  . Smoking status: Current Some Day Smoker  . Smokeless tobacco: Never Used  . Tobacco comment: smokes hookah occasionally  Substance and Sexual Activity  . Alcohol use: Yes    Comment: socially   . Drug  use: No  . Sexual activity: Yes    Birth control/protection: None  Other Topics Concern  . Not on file  Social History Narrative  . Not on file   No current facility-administered medications on file prior to encounter.    Current Outpatient Medications on File Prior to Encounter  Medication Sig Dispense Refill  . cyclobenzaprine (FLEXERIL) 5 MG tablet Take 1 tablet (5 mg total) by mouth 3 (three) times daily as needed for muscle spasms. 15 tablet 0  . glycopyrrolate (ROBINUL) 1 MG tablet Take 1 tablet (1 mg total) by mouth 3 (three) times daily. 90 tablet 0  . Prenatal Vit-Fe Fumarate-FA (PRENATAL MULTIVITAMIN) TABS tablet Take 1 tablet by mouth daily at 12 noon.    Marland Kitchen terconazole (TERAZOL 7) 0.4 % vaginal cream Place 1 applicator vaginally at bedtime. 45 g 0   No Known Allergies  ROS:  Review of Systems  Constitutional: Negative for chills, fatigue and fever.  Respiratory: Negative for shortness of breath.   Cardiovascular: Negative for chest pain.  Gastrointestinal: Positive for abdominal pain.  Genitourinary: Positive for pelvic pain. Negative for difficulty urinating, dysuria, flank pain, vaginal bleeding, vaginal discharge and vaginal pain.  Musculoskeletal: Positive for back pain.  Neurological: Negative for dizziness and headaches.  Psychiatric/Behavioral: Negative.      I have reviewed patient's Past Medical Hx, Surgical Hx, Family Hx, Social Hx, medications and allergies.  Physical Exam   Patient Vitals for the past 24 hrs:  BP Temp Temp src Pulse Resp SpO2  11/03/17 2327 122/75 - - (!) 103 - -  11/03/17 2000 96/78 98.2 F (36.8 C) Oral (!) 106 18 100 %   Constitutional: Well-developed, well-nourished female in no acute distress.  Cardiovascular: normal rate Respiratory: normal effort GI: Abd soft, non-tender. Pos BS x 4 MS: Extremities nontender, no edema, normal ROM Neurologic: Alert and oriented x 4.  GU: Neg CVAT.   Dilation: Closed Effacement (%):  Thick Exam by:: Blanch Media CNM    Thick clumpy white/yellow discharge on glove following exam.  FHT 153 by doppler  LAB RESULTS Results for orders placed or performed during the hospital encounter of 11/03/17 (from the past 24 hour(s))  Urinalysis, Routine w reflex microscopic     Status: Abnormal   Collection Time: 11/03/17  7:55 PM  Result Value Ref Range   Color, Urine YELLOW YELLOW   APPearance CLEAR CLEAR   Specific Gravity, Urine 1.023 1.005 - 1.030   pH 6.0 5.0 - 8.0   Glucose, UA NEGATIVE NEGATIVE mg/dL   Hgb urine dipstick NEGATIVE NEGATIVE   Bilirubin Urine NEGATIVE NEGATIVE   Ketones, ur NEGATIVE NEGATIVE mg/dL   Protein, ur NEGATIVE NEGATIVE mg/dL   Nitrite NEGATIVE NEGATIVE   Leukocytes, UA LARGE (A) NEGATIVE   RBC / HPF 0-5 0 - 5 RBC/hpf   WBC, UA 0-5 0 - 5 WBC/hpf   Bacteria, UA RARE (A) NONE SEEN   Squamous Epithelial / LPF 0-5 (A) NONE SEEN   Mucus PRESENT        IMAGING No results found.  MAU Management/MDM: UA with large leukocytes, rare bacteria and limited squamous cells.  Possible UTI, will send for culture but given pt symptoms, will treat for presumptive UTI with Bactrim DS BID x 7 days, first dose tonight in MAU.   Pain that improves with knees to chest/leaning forward is c/w with round ligament pain.  Rest/ice/heat/warm bath/Tylenol/pregnancy support belt for pain.  Researched with pt low-cost options for pregnancy support belt.  Percocet 5/325 x 1 tablet given in MAU and Rx for Percocet 5/325, take 1-2 Q 6 hours PRN x 6 tabs.  Treat yeast with Terazol as prescribed, discussed how to insert medication with applicator tonight.  F/U in office as scheduled, return to MAU as needed for emergencies.  Pt discharged with strict preterm labor precautions.  ASSESSMENT 1. UTI (urinary tract infection) during pregnancy, second trimester   2. Pain of round ligament affecting pregnancy, antepartum   3. Uterine fibroids affecting pregnancy in second  trimester     PLAN Discharge home Allergies as of 11/03/2017   No Known Allergies     Medication List    TAKE these medications   cyclobenzaprine 5 MG tablet Commonly known as:  FLEXERIL Take 1 tablet (5 mg total) by mouth 3 (three) times daily as needed for muscle spasms.   glycopyrrolate 1 MG tablet Commonly known as:  ROBINUL Take 1 tablet (1 mg total) by mouth 3 (three) times daily.   oxyCODONE-acetaminophen 5-325 MG tablet Commonly known as:  PERCOCET/ROXICET Take 1-2 tablets by mouth every 6 (six) hours as needed for severe pain.   prenatal multivitamin Tabs tablet Take 1 tablet by mouth daily at 12 noon.   sulfamethoxazole-trimethoprim 800-160 MG tablet Commonly known as:  BACTRIM DS,SEPTRA DS Take 1 tablet by mouth 2 (two) times daily for 7 days.   terconazole 0.4 % vaginal  cream Commonly known as:  TERAZOL 7 Place 1 applicator vaginally at bedtime.      Follow-up Information    Department, [facility] Follow up.   Why:  As scheduled, return to MAU as needed for emergencies Contact information: [place] [address] [phone]           Lisa Leftwich-Kirby Certified Nurse-Midwife 11/03/2017  11:51 PM

## 2017-11-05 LAB — CULTURE, OB URINE

## 2018-02-08 ENCOUNTER — Other Ambulatory Visit: Payer: Self-pay | Admitting: Obstetrics and Gynecology

## 2018-02-08 DIAGNOSIS — Z3689 Encounter for other specified antenatal screening: Secondary | ICD-10-CM

## 2018-02-09 ENCOUNTER — Encounter (HOSPITAL_COMMUNITY): Payer: Self-pay

## 2018-02-17 ENCOUNTER — Other Ambulatory Visit: Payer: Self-pay | Admitting: Obstetrics and Gynecology

## 2018-02-17 ENCOUNTER — Encounter (HOSPITAL_COMMUNITY): Payer: Self-pay

## 2018-02-17 ENCOUNTER — Ambulatory Visit (HOSPITAL_COMMUNITY)
Admission: RE | Admit: 2018-02-17 | Discharge: 2018-02-17 | Disposition: A | Discharge status: Home / Self Care | Payer: Self-pay | Source: Ambulatory Visit | Attending: Obstetrics and Gynecology | Admitting: Obstetrics and Gynecology

## 2018-02-17 DIAGNOSIS — O3413 Maternal care for benign tumor of corpus uteri, third trimester: Secondary | ICD-10-CM | POA: Insufficient documentation

## 2018-02-17 DIAGNOSIS — Z3A33 33 weeks gestation of pregnancy: Secondary | ICD-10-CM

## 2018-02-17 DIAGNOSIS — Z363 Encounter for antenatal screening for malformations: Secondary | ICD-10-CM | POA: Insufficient documentation

## 2018-02-17 DIAGNOSIS — O34219 Maternal care for unspecified type scar from previous cesarean delivery: Secondary | ICD-10-CM

## 2018-02-17 DIAGNOSIS — D259 Leiomyoma of uterus, unspecified: Secondary | ICD-10-CM | POA: Insufficient documentation

## 2018-02-17 DIAGNOSIS — Z3689 Encounter for other specified antenatal screening: Secondary | ICD-10-CM

## 2018-02-17 HISTORY — DX: Unspecified abnormal cytological findings in specimens from vagina: R87.629

## 2018-02-23 ENCOUNTER — Encounter (HOSPITAL_COMMUNITY): Payer: Self-pay

## 2018-03-15 ENCOUNTER — Encounter (HOSPITAL_COMMUNITY): Payer: Self-pay

## 2018-03-15 NOTE — Pre-Procedure Instructions (Signed)
Preadmission screen Interpreter number [phone]

## 2018-03-28 ENCOUNTER — Encounter (HOSPITAL_COMMUNITY)
Admission: RE | Admit: 2018-03-28 | Discharge: 2018-03-28 | Disposition: A | Discharge status: Home / Self Care | Payer: Medicaid Other | Source: Ambulatory Visit | Attending: Obstetrics & Gynecology | Admitting: Obstetrics & Gynecology

## 2018-03-28 HISTORY — DX: Benign neoplasm of connective and other soft tissue, unspecified: D21.9

## 2018-03-28 LAB — CBC
HCT: 35.3 % — ABNORMAL LOW (ref 36.0–46.0)
HEMOGLOBIN: 11.6 g/dL — AB (ref 12.0–15.0)
MCH: 29.4 pg (ref 26.0–34.0)
MCHC: 32.9 g/dL (ref 30.0–36.0)
MCV: 89.6 fL (ref 78.0–100.0)
Platelets: 232 10*3/uL (ref 150–400)
RBC: 3.94 MIL/uL (ref 3.87–5.11)
RDW: 13.8 % (ref 11.5–15.5)
WBC: 6.3 10*3/uL (ref 4.0–10.5)

## 2018-03-28 LAB — ABO/RH: ABO/RH(D): A POS

## 2018-03-28 LAB — TYPE AND SCREEN
ABO/RH(D): A POS
ANTIBODY SCREEN: NEGATIVE

## 2018-03-28 NOTE — Patient Instructions (Signed)
Instrucciones Para Antes de la Ciruga   Su ciruga est programada para 03/29/2018  (your procedure is scheduled on) Entre por la entrada [facility]  a las 12:00 [place] -(enter through the main entrance at [facility] [place], [place] [address] para informarnos de su llegada. (pick up phone, dial [phone] on arrival)     Por favor llame al [phone] si tiene algn problema en la maana de la ciruga (please call this number if you have any problems the morning of surgery.)                  Recuerde: (Remember)  No coma alimentos. (Do not eat food (After Midnight) Desps de medianoche)    No tome lquidos claros. (Do not drink clear liquids (6 Hours before arrival) 6 horas ante llegada) 6:00 AM   No use joyas, maquillaje de ojos, lpiz labial, crema para el cuerpo o esmalte de uas oscuro. (Do not wear jewelry, eye makeup, lipstick, body lotion, or dark fingernail polish). Puede usar desodorante (you may wear deodorant)    No se afeite 48 horas de su ciruga. (Do not shave 48 hours before your surgery)    No traiga objetos de valor al hospital.  [facility] no se hace responsable de ninguna pertenencia, ni objetos de valor que haya trado al hospital. (Do not bring valuable to the hospital.  [facility] is not responsible for any belongings or valuables brought to the hospital)   [facility] medicinas en la maana de la ciruga con un SORBITO de agua nada (take these meds the morning of surgery with a SIP of water)     Durante la ciruga no se pueden usar lentes de contacto, dentaduras o puentes. (Contacts, dentures or bridgework cannot be worn in surgery).   Si va a ser ingresado despus de la ciruga, deje la [employer] en el carro hasta que se le haya asignado una habitacin. (If you are to be admitted after surgery, leave suitcase in car until your room has been assigned.)   A los pacientes que se  les d de alta el mismo da no se les permitir manejar a casa.  (Patients discharged on the day of surgery will not be allowed to drive home)    [place] [employer] telfono del Teacher, adult education na. (Name and telephone number of your driver)   Instrucciones especiales N/A (Special Instructions)   Por favor, lea las hojas informativas que le entregaron. (Please read over the following fact sheets that you were given) Surgical Site Infection Prevention

## 2018-03-29 ENCOUNTER — Inpatient Hospital Stay (HOSPITAL_COMMUNITY): Payer: Medicaid Other | Admitting: Certified Registered"

## 2018-03-29 ENCOUNTER — Encounter (HOSPITAL_COMMUNITY): Payer: Self-pay | Admitting: *Deleted

## 2018-03-29 ENCOUNTER — Encounter (HOSPITAL_COMMUNITY)
Admission: RE | Disposition: A | Discharge status: Home / Self Care | Payer: Self-pay | Source: Home / Self Care | Attending: Family Medicine

## 2018-03-29 ENCOUNTER — Inpatient Hospital Stay (HOSPITAL_COMMUNITY)
Admission: RE | Admit: 2018-03-29 | Discharge: 2018-03-31 | DRG: 788 | Disposition: A | Discharge status: Home / Self Care | Payer: Medicaid Other | Attending: Family Medicine | Admitting: Family Medicine

## 2018-03-29 DIAGNOSIS — O34211 Maternal care for low transverse scar from previous cesarean delivery: Secondary | ICD-10-CM | POA: Diagnosis present

## 2018-03-29 DIAGNOSIS — D259 Leiomyoma of uterus, unspecified: Secondary | ICD-10-CM | POA: Diagnosis not present

## 2018-03-29 DIAGNOSIS — D250 Submucous leiomyoma of uterus: Secondary | ICD-10-CM | POA: Diagnosis present

## 2018-03-29 DIAGNOSIS — O3413 Maternal care for benign tumor of corpus uteri, third trimester: Secondary | ICD-10-CM | POA: Diagnosis present

## 2018-03-29 DIAGNOSIS — Z98891 History of uterine scar from previous surgery: Secondary | ICD-10-CM

## 2018-03-29 DIAGNOSIS — Z3A39 39 weeks gestation of pregnancy: Secondary | ICD-10-CM | POA: Diagnosis not present

## 2018-03-29 DIAGNOSIS — Z87891 Personal history of nicotine dependence: Secondary | ICD-10-CM

## 2018-03-29 HISTORY — DX: Moderate cervical dysplasia: N87.1

## 2018-03-29 LAB — CREATININE, SERUM
CREATININE: 0.5 mg/dL (ref 0.44–1.00)
GFR calc Af Amer: >60 mL/min (ref >60)

## 2018-03-29 LAB — RPR: RPR: NONREACTIVE

## 2018-03-29 SURGERY — Surgical Case
Anesthesia: Spinal | Site: Abdomen | Wound class: Clean Contaminated

## 2018-03-29 MED ORDER — MENTHOL 3 MG MT LOZG: 1.0000 | LOZENGE | OROMUCOSAL | Status: DC | PRN

## 2018-03-29 MED ORDER — GLYCOPYRROLATE 0.2 MG/ML IJ SOLN
INTRAMUSCULAR | Status: DC | PRN
  Administered 2018-03-29: 0.2 mg via INTRAVENOUS

## 2018-03-29 MED ORDER — NALBUPHINE HCL 10 MG/ML IJ SOLN: 5.0000 mg | INTRAMUSCULAR | Status: DC | PRN

## 2018-03-29 MED ORDER — NALBUPHINE HCL 10 MG/ML IJ SOLN: 5.0000 mg | Freq: Once | INTRAMUSCULAR | Status: AC | PRN

## 2018-03-29 MED ORDER — NALBUPHINE HCL 10 MG/ML IJ SOLN
5.0000 mg | Freq: Once | INTRAMUSCULAR | Status: AC | PRN
  Administered 2018-03-29: 5 mg via INTRAVENOUS

## 2018-03-29 MED ORDER — ACETAMINOPHEN 500 MG PO TABS
1000.0000 mg | ORAL_TABLET | Freq: Four times a day (QID) | ORAL | Status: AC
  Administered 2018-03-30 (×3): 1000 mg via ORAL
  Filled 2018-03-29 (×3): qty 2

## 2018-03-29 MED ORDER — SIMETHICONE 80 MG PO CHEW
80.0000 mg | CHEWABLE_TABLET | ORAL | Status: DC
  Administered 2018-03-30 (×2): 80 mg via ORAL
  Filled 2018-03-29 (×2): qty 1

## 2018-03-29 MED ORDER — PHENYLEPHRINE HCL 10 MG/ML IJ SOLN
INTRAMUSCULAR | Status: DC | PRN
  Administered 2018-03-29: 80 ug via INTRAVENOUS

## 2018-03-29 MED ORDER — LACTATED RINGERS IV SOLN
INTRAVENOUS | Status: DC | PRN
  Administered 2018-03-29: 15:00:00 via INTRAVENOUS

## 2018-03-29 MED ORDER — SENNOSIDES-DOCUSATE SODIUM 8.6-50 MG PO TABS
2.0000 | ORAL_TABLET | ORAL | Status: DC
  Administered 2018-03-30 (×2): 2 via ORAL
  Filled 2018-03-29 (×2): qty 2

## 2018-03-29 MED ORDER — COCONUT OIL OIL: 1.0000 "application " | TOPICAL_OIL | Status: DC | PRN

## 2018-03-29 MED ORDER — DIPHENHYDRAMINE HCL 25 MG PO CAPS: 25.0000 mg | ORAL_CAPSULE | Freq: Four times a day (QID) | ORAL | Status: DC | PRN

## 2018-03-29 MED ORDER — DIPHENHYDRAMINE HCL 50 MG/ML IJ SOLN: 12.5000 mg | INTRAMUSCULAR | Status: DC | PRN

## 2018-03-29 MED ORDER — OXYCODONE-ACETAMINOPHEN 5-325 MG PO TABS
1.0000 | ORAL_TABLET | ORAL | Status: DC | PRN
  Administered 2018-03-31: 1 via ORAL
  Filled 2018-03-29: qty 1

## 2018-03-29 MED ORDER — FENTANYL CITRATE (PF) 100 MCG/2ML IJ SOLN
INTRAMUSCULAR | Status: DC | PRN
  Administered 2018-03-29: 20 ug via INTRATHECAL

## 2018-03-29 MED ORDER — WITCH HAZEL-GLYCERIN EX PADS: 1.0000 "application " | MEDICATED_PAD | CUTANEOUS | Status: DC | PRN

## 2018-03-29 MED ORDER — OXYTOCIN 40 UNITS IN LACTATED RINGERS INFUSION - SIMPLE MED: 2.5000 [IU]/h | INTRAVENOUS | Status: AC

## 2018-03-29 MED ORDER — ONDANSETRON HCL 4 MG/2ML IJ SOLN
INTRAMUSCULAR | Status: DC | PRN
  Administered 2018-03-29: 4 mg via INTRAVENOUS

## 2018-03-29 MED ORDER — GLYCOPYRROLATE 0.2 MG/ML IJ SOLN
INTRAMUSCULAR | Status: AC
  Filled 2018-03-29: qty 1

## 2018-03-29 MED ORDER — PRENATAL MULTIVITAMIN CH
1.0000 | ORAL_TABLET | Freq: Every day | ORAL | Status: DC
  Administered 2018-03-30 – 2018-03-31 (×2): 1 via ORAL
  Filled 2018-03-29 (×2): qty 1

## 2018-03-29 MED ORDER — KETOROLAC TROMETHAMINE 30 MG/ML IJ SOLN
INTRAMUSCULAR | Status: AC
  Administered 2018-03-29: 30 mg via INTRAMUSCULAR
  Filled 2018-03-29: qty 1

## 2018-03-29 MED ORDER — LACTATED RINGERS IV SOLN
INTRAVENOUS | Status: DC
  Administered 2018-03-29 (×4): via INTRAVENOUS

## 2018-03-29 MED ORDER — NALOXONE HCL 0.4 MG/ML IJ SOLN: 0.4000 mg | INTRAMUSCULAR | Status: DC | PRN

## 2018-03-29 MED ORDER — DEXAMETHASONE SODIUM PHOSPHATE 4 MG/ML IJ SOLN
INTRAMUSCULAR | Status: AC
  Filled 2018-03-29: qty 1

## 2018-03-29 MED ORDER — IBUPROFEN 600 MG PO TABS
600.0000 mg | ORAL_TABLET | Freq: Four times a day (QID) | ORAL | Status: DC
  Administered 2018-03-30 – 2018-03-31 (×7): 600 mg via ORAL
  Filled 2018-03-29 (×7): qty 1

## 2018-03-29 MED ORDER — DIBUCAINE 1 % RE OINT: 1.0000 "application " | TOPICAL_OINTMENT | RECTAL | Status: DC | PRN

## 2018-03-29 MED ORDER — ZOLPIDEM TARTRATE 5 MG PO TABS: 5.0000 mg | ORAL_TABLET | Freq: Every evening | ORAL | Status: DC | PRN

## 2018-03-29 MED ORDER — LACTATED RINGERS IV SOLN
INTRAVENOUS | Status: DC
  Administered 2018-03-30: 02:00:00 via INTRAVENOUS

## 2018-03-29 MED ORDER — DIPHENHYDRAMINE HCL 50 MG/ML IJ SOLN
INTRAMUSCULAR | Status: AC
  Filled 2018-03-29: qty 1

## 2018-03-29 MED ORDER — SIMETHICONE 80 MG PO CHEW
80.0000 mg | CHEWABLE_TABLET | Freq: Three times a day (TID) | ORAL | Status: DC
  Administered 2018-03-30 – 2018-03-31 (×3): 80 mg via ORAL
  Filled 2018-03-29 (×3): qty 1

## 2018-03-29 MED ORDER — SIMETHICONE 80 MG PO CHEW: 80.0000 mg | CHEWABLE_TABLET | ORAL | Status: DC | PRN

## 2018-03-29 MED ORDER — DIPHENHYDRAMINE HCL 25 MG PO CAPS
25.0000 mg | ORAL_CAPSULE | ORAL | Status: DC | PRN
Start: 2018-03-29 — End: 2018-03-31
  Administered 2018-03-30: 25 mg via ORAL
  Filled 2018-03-29 (×2): qty 1

## 2018-03-29 MED ORDER — OXYTOCIN 40 UNITS IN LACTATED RINGERS INFUSION - SIMPLE MED
INTRAVENOUS | Status: DC | PRN
  Administered 2018-03-29: 40 mL via INTRAVENOUS

## 2018-03-29 MED ORDER — CEFAZOLIN SODIUM-DEXTROSE 2-4 GM/100ML-% IV SOLN: 2.0000 g | INTRAVENOUS | Status: DC

## 2018-03-29 MED ORDER — NALBUPHINE HCL 10 MG/ML IJ SOLN
INTRAMUSCULAR | Status: AC
  Administered 2018-03-29: 5 mg via INTRAVENOUS
  Filled 2018-03-29: qty 1

## 2018-03-29 MED ORDER — NALOXONE HCL 4 MG/10ML IJ SOLN
1.0000 ug/kg/h | INTRAVENOUS | Status: DC | PRN
  Filled 2018-03-29: qty 5

## 2018-03-29 MED ORDER — ONDANSETRON HCL 4 MG/2ML IJ SOLN
INTRAMUSCULAR | Status: AC
  Filled 2018-03-29: qty 2

## 2018-03-29 MED ORDER — OXYCODONE-ACETAMINOPHEN 5-325 MG PO TABS: 2.0000 | ORAL_TABLET | ORAL | Status: DC | PRN

## 2018-03-29 MED ORDER — ONDANSETRON HCL 4 MG/2ML IJ SOLN: 4.0000 mg | Freq: Three times a day (TID) | INTRAMUSCULAR | Status: DC | PRN

## 2018-03-29 MED ORDER — MORPHINE SULFATE (PF) 0.5 MG/ML IJ SOLN
INTRAMUSCULAR | Status: DC | PRN
  Administered 2018-03-29: .2 mg via INTRATHECAL

## 2018-03-29 MED ORDER — SODIUM CHLORIDE 0.9% FLUSH: 3.0000 mL | INTRAVENOUS | Status: DC | PRN

## 2018-03-29 MED ORDER — KETOROLAC TROMETHAMINE 30 MG/ML IJ SOLN
30.0000 mg | Freq: Four times a day (QID) | INTRAMUSCULAR | Status: AC | PRN
  Administered 2018-03-29: 30 mg via INTRAMUSCULAR

## 2018-03-29 MED ORDER — OXYTOCIN 10 UNIT/ML IJ SOLN
INTRAMUSCULAR | Status: AC
  Filled 2018-03-29: qty 4

## 2018-03-29 MED ORDER — DEXAMETHASONE SODIUM PHOSPHATE 4 MG/ML IJ SOLN
INTRAMUSCULAR | Status: DC | PRN
  Administered 2018-03-29: 10 mg via INTRAVENOUS

## 2018-03-29 MED ORDER — ACETAMINOPHEN 325 MG PO TABS
650.0000 mg | ORAL_TABLET | ORAL | Status: DC | PRN
  Administered 2018-03-30 – 2018-03-31 (×3): 650 mg via ORAL
  Filled 2018-03-29 (×3): qty 2

## 2018-03-29 MED ORDER — PHENYLEPHRINE 8 MG IN D5W 100 ML (0.08MG/ML) PREMIX OPTIME
INJECTION | INTRAVENOUS | Status: AC
  Filled 2018-03-29: qty 100

## 2018-03-29 MED ORDER — TETANUS-DIPHTH-ACELL PERTUSSIS 5-2.5-18.5 LF-MCG/0.5 IM SUSP: 0.5000 mL | Freq: Once | INTRAMUSCULAR | Status: DC

## 2018-03-29 MED ORDER — ENOXAPARIN SODIUM 40 MG/0.4ML ~~LOC~~ SOLN
40.0000 mg | SUBCUTANEOUS | Status: DC
  Administered 2018-03-30: 40 mg via SUBCUTANEOUS
  Filled 2018-03-29: qty 0.4

## 2018-03-29 MED ORDER — SCOPOLAMINE 1 MG/3DAYS TD PT72
1.0000 | MEDICATED_PATCH | Freq: Once | TRANSDERMAL | Status: DC
  Administered 2018-03-29: 1.5 mg via TRANSDERMAL
  Filled 2018-03-29: qty 1

## 2018-03-29 MED ORDER — KETOROLAC TROMETHAMINE 30 MG/ML IJ SOLN: 30.0000 mg | Freq: Four times a day (QID) | INTRAMUSCULAR | Status: AC | PRN

## 2018-03-29 MED ORDER — CEFAZOLIN SODIUM-DEXTROSE 1-4 GM/50ML-% IV SOLN
INTRAVENOUS | Status: DC | PRN
  Administered 2018-03-29: 2 g via INTRAVENOUS

## 2018-03-29 MED ORDER — MORPHINE SULFATE (PF) 0.5 MG/ML IJ SOLN
INTRAMUSCULAR | Status: AC
  Filled 2018-03-29: qty 10

## 2018-03-29 MED ORDER — PHENYLEPHRINE 8 MG IN D5W 100 ML (0.08MG/ML) PREMIX OPTIME
INJECTION | INTRAVENOUS | Status: DC | PRN
  Administered 2018-03-29: 60 ug/min via INTRAVENOUS

## 2018-03-29 MED ORDER — FENTANYL CITRATE (PF) 100 MCG/2ML IJ SOLN
INTRAMUSCULAR | Status: AC
  Filled 2018-03-29: qty 2

## 2018-03-29 MED ORDER — NALBUPHINE HCL 10 MG/ML IJ SOLN
5.0000 mg | INTRAMUSCULAR | Status: DC | PRN
  Administered 2018-03-30: 5 mg via INTRAVENOUS
  Filled 2018-03-29: qty 1

## 2018-03-29 MED ORDER — SODIUM CHLORIDE 0.9 % IR SOLN
Status: DC | PRN
  Administered 2018-03-29: 1000 mL

## 2018-03-29 MED ORDER — STERILE WATER FOR IRRIGATION IR SOLN
Status: DC | PRN
  Administered 2018-03-29: 1000 mL

## 2018-03-29 SURGICAL SUPPLY — 28 items
BENZOIN TINCTURE PRP APPL 2/3 (GAUZE/BANDAGES/DRESSINGS) ×3 IMPLANT
CHLORAPREP W/TINT 26ML (MISCELLANEOUS) ×3 IMPLANT
CLAMP CORD UMBIL (MISCELLANEOUS) ×3 IMPLANT
CLOSURE WOUND 1/2 X4 (GAUZE/BANDAGES/DRESSINGS) ×1
CLOTH BEACON ORANGE TIMEOUT ST (SAFETY) ×3 IMPLANT
DRSG OPSITE POSTOP 4X10 (GAUZE/BANDAGES/DRESSINGS) ×3 IMPLANT
ELECT REM PT RETURN 9FT ADLT (ELECTROSURGICAL) ×3
ELECTRODE REM PT RTRN 9FT ADLT (ELECTROSURGICAL) ×1 IMPLANT
GLOVE BIOGEL PI IND STRL 7.0 (GLOVE) ×3 IMPLANT
GLOVE BIOGEL PI INDICATOR 7.0 (GLOVE) ×6
GLOVE ECLIPSE 7.0 STRL STRAW (GLOVE) ×3 IMPLANT
GOWN STRL REUS W/TWL LRG LVL3 (GOWN DISPOSABLE) ×6 IMPLANT
NEEDLE HYPO 22GX1.5 SAFETY (NEEDLE) ×3 IMPLANT
NEEDLE HYPO 25X5/8 SAFETYGLIDE (NEEDLE) ×3 IMPLANT
NS IRRIG 1000ML POUR BTL (IV SOLUTION) ×3 IMPLANT
PACK C SECTION WH (CUSTOM PROCEDURE TRAY) ×3 IMPLANT
PAD ABD 7.5X8 STRL (GAUZE/BANDAGES/DRESSINGS) ×3 IMPLANT
PAD OB MATERNITY 4.3X12.25 (PERSONAL CARE ITEMS) ×3 IMPLANT
PENCIL SMOKE EVAC W/HOLSTER (ELECTROSURGICAL) ×3 IMPLANT
RTRCTR C-SECT PINK 25CM LRG (MISCELLANEOUS) ×3 IMPLANT
STRIP CLOSURE SKIN 1/2X4 (GAUZE/BANDAGES/DRESSINGS) ×2 IMPLANT
SUT PDS AB 0 CTX 36 PDP370T (SUTURE) ×3 IMPLANT
SUT VIC AB 0 CTX 36 (SUTURE) ×4
SUT VIC AB 0 CTX36XBRD ANBCTRL (SUTURE) ×2 IMPLANT
SUT VIC AB 4-0 KS 27 (SUTURE) ×3 IMPLANT
SYR CONTROL 10ML LL (SYRINGE) ×3 IMPLANT
TOWEL OR 17X24 6PK STRL BLUE (TOWEL DISPOSABLE) ×3 IMPLANT
TRAY FOLEY W/BAG SLVR 14FR LF (SET/KITS/TRAYS/PACK) ×3 IMPLANT

## 2018-03-29 NOTE — Transfer of Care (Signed)
Immediate Anesthesia Transfer of Care Note  Patient: Tracie Walsh  Procedure(s) Performed: REPEAT CESAREAN SECTION (N/A Abdomen)  Patient Location: PACU  Anesthesia Type:Spinal  Level of Consciousness: awake, alert  and oriented  Airway & Oxygen Therapy: Patient Spontanous Breathing  Post-op Assessment: Report given to RN and Post -op Vital signs reviewed and stable  Post vital signs: Reviewed and stable  Last Vitals:  Vitals Value Taken Time  BP 107/64 03/29/2018  4:08 PM  Temp    Pulse 85 03/29/2018  4:09 PM  Resp 16 03/29/2018  4:09 PM  SpO2 97 % 03/29/2018  4:09 PM  Vitals shown include unvalidated device data.  Last Pain:  Vitals:   03/29/18 1258  TempSrc: Oral         Complications: No apparent anesthesia complications

## 2018-03-29 NOTE — Lactation Note (Signed)
This note was copied from a baby's chart. Lactation Consultation Note  Patient Name: Boy Luevenia Mcavoy EQUHK'I Date: 03/29/2018 Reason for consult: Initial assessment   P2, 81 7 hours old.  [place] interpreter present for [place]. Mother states she breastfed her first child for one month until her milk supply decreased. Reviewed hand expression with drops expressed to encourage mother about her supply. Good flow of colostrum expressed.  Nipples evert and compressible. Mom encouraged to feed baby 8-12 times/24 hours and with feeding cues.  Discussed basics and recommend mother call RN tonight to observe feeding when baby wakes. Mom made aware of O/P services, breastfeeding support groups, community resources, and our phone # for post-discharge questions.      Maternal Data Has patient been taught Hand Expression?: Yes Does the patient have breastfeeding experience prior to this delivery?: Yes  Feeding    LATCH Score                   Interventions Interventions: Breast feeding basics reviewed;Hand express  Lactation Tools Discussed/Used     Consult Status Consult Status: Follow-up Date: 03/30/18 Follow-up type: In-patient    Vivianne Master [facility] 03/29/2018, 10:14 PM

## 2018-03-29 NOTE — Anesthesia Preprocedure Evaluation (Signed)
Anesthesia Evaluation  Patient identified by MRN, date of birth, ID band Patient awake    Reviewed: Allergy & Precautions, H&P , NPO status , Patient's Chart, lab work & pertinent test results  Airway Mallampati: I  TM Distance: >3 FB Neck ROM: full    Dental no notable dental hx. (+) Teeth Intact   Pulmonary neg pulmonary ROS, former smoker,    Pulmonary exam normal breath sounds clear to auscultation       Cardiovascular negative cardio ROS Normal cardiovascular exam Rhythm:regular Rate:Normal     Neuro/Psych negative neurological ROS  negative psych ROS   GI/Hepatic negative GI ROS, Neg liver ROS,   Endo/Other  negative endocrine ROS  Renal/GU negative Renal ROS     Musculoskeletal   Abdominal Normal abdominal exam  (+)   Peds  Hematology negative hematology ROS (+)   Anesthesia Other Findings   Reproductive/Obstetrics (+) Pregnancy                             Anesthesia Physical Anesthesia Plan  ASA: II  Anesthesia Plan: Spinal   Post-op Pain Management:    Induction:   PONV Risk Score and Plan: 3 and Ondansetron, Dexamethasone and Scopolamine patch - Pre-op  Airway Management Planned: Natural Airway and Nasal Cannula  Additional Equipment:   Intra-op Plan:   Post-operative Plan:   Informed Consent: I have reviewed the patients History and Physical, chart, labs and discussed the procedure including the risks, benefits and alternatives for the proposed anesthesia with the patient or authorized representative who has indicated his/her understanding and acceptance.     Plan Discussed with: CRNA and Surgeon  Anesthesia Plan Comments:         Anesthesia Quick Evaluation

## 2018-03-29 NOTE — Op Note (Signed)
Nerine O Walsh PROCEDURE DATE: 03/29/2018  PREOPERATIVE DIAGNOSIS: Intrauterine pregnancy at  [redacted]w[redacted]d weeks gestation; fibroids, elective repeat  POSTOPERATIVE DIAGNOSIS: The same  PROCEDURE: Repeat Low Transverse Cesarean Section  SURGEON:  Dr. Loma Boston  ASSISTANT:   INDICATIONS: Tracie Walsh is a 32 y.o. G2P1001 at [redacted]w[redacted]d scheduled for cesarean section secondary to elective repeat, fibroids.  The risks of cesarean section discussed with the patient included but were not limited to: bleeding which may require transfusion or reoperation; infection which may require antibiotics; injury to bowel, bladder, ureters or other surrounding organs; injury to the fetus; need for additional procedures including hysterectomy in the event of a life-threatening hemorrhage; placental abnormalities wth subsequent pregnancies, incisional problems, thromboembolic phenomenon and other postoperative/anesthesia complications. The patient concurred with the proposed plan, giving informed written consent for the procedure.    FINDINGS:  Viable female infant in ROA presentation.  Apgars 9 and 9, weight pending.  Clear amniotic fluid.  Intact placenta, three vessel cord. Normal uterus, fallopian tubes and ovaries bilaterally. 5cm fibroid in the lower uterine segment. 4 cm anterior fibroid. >10cm Pedunculated fundal fibroid.  ANESTHESIA:    Spinal INTRAVENOUS FLUIDS: 2500 ml ESTIMATED BLOOD LOSS: 565 ml URINE OUTPUT:  220 ml SPECIMENS: Placenta sent to L&D COMPLICATIONS: None immediate  PROCEDURE IN DETAIL:  The patient received intravenous antibiotics and had sequential compression devices applied to her lower extremities while in the preoperative area.  She was then taken to the operating room where spinal anesthesia was administered and was found to be adequate. She was then placed in a dorsal supine position with a leftward tilt, and prepped and draped in a sterile manner.  A foley catheter was  placed into her bladder and attached to constant gravity, which drained clear fluid throughout.  After an adequate timeout was performed, a Pfannenstiel skin incision was made with scalpel and carried through to the underlying layer of fascia. The fascia was incised in the midline and this incision was extended bilaterally using the Mayo scissors. Kocher clamps were applied to the superior aspect of the fascial incision and the underlying rectus muscles were dissected off bluntly. A similar process was carried out on the inferior aspect of the facial incision. The rectus muscles were separated in the midline bluntly and the peritoneum was entered bluntly. An Alexis retractor was placed to aid in visualization of the uterus.  Attention was turned to the lower uterine segment. A 5cm submucosal fibroid was noted in the lower uterine segment. A 4cm fibroid was noted in the anterior wall of the uterus and an extremely large (>10cm) pedunculated fibroid was noted in the fundus. In the lower uterine segment, a transverse hysterotomy was made with a scalpel and extended bilaterally bluntly. The infant was successfully delivered, and cord was clamped and cut and infant was handed over to awaiting neonatology team. Uterine massage was then administered and the placenta delivered intact with three-vessel cord. The uterus was then cleared of clot and debris.  The hysterotomy was closed with 0 Vicryl in a running locked fashion, and an imbricating layer was also placed with a 0 Vicryl. 2-0 vicryl was used to close a tear in the mucosa of the fibroid in the lower uterine segment. Overall, excellent hemostasis was noted. The abdomen and the pelvis were cleared of all clot and debris and the Ubaldo Glassing was removed. Hemostasis was confirmed on all surfaces.  The peritoneum was reapproximated using 2-0 vicryl running stitches and a single interrupted stitch was placed  to approximate the rectus muscles. The fascia was then closed using 0  Vicryl in a running fashion. The skin was closed with 4-0 vicryl. The patient tolerated the procedure well. Sponge, lap, instrument and needle counts were correct x 2. She was taken to the recovery room in stable condition.    Truett Mainland, DO 03/29/2018 3:58 PM

## 2018-03-29 NOTE — Anesthesia Procedure Notes (Signed)
Spinal  Patient location during procedure: OR Start time: 03/29/2018 2:48 PM End time: 03/29/2018 2:53 PM Staffing Anesthesiologist: Lyn Hollingshead, MD Performed: anesthesiologist  Preanesthetic Checklist Completed: patient identified, site marked, surgical consent, pre-op evaluation, timeout performed, IV checked, risks and benefits discussed and monitors and equipment checked Spinal Block Patient position: sitting Prep: ChloraPrep Patient monitoring: heart rate and continuous pulse ox Approach: midline Location: L4-5 Injection technique: single-shot Needle Needle type: Pencan  Needle gauge: 24 G Needle length: 10 cm Needle insertion depth: 6 cm Assessment Sensory level: T4

## 2018-03-29 NOTE — H&P (Signed)
Obstetric Preoperative History and Physical  Tracie Walsh is a 32 y.o. G2P1001 with IUP at [redacted]w[redacted]d presenting for scheduled elective repeat cesarean section.  Patient is Spanish-speaking only, Spanish interpreter present for this encounter. Here with husband.  Reports good fetal movement, no bleeding, no contractions, no leaking of fluid.  No acute preoperative concerns.    Cesarean Section Indication: Elective repeat, patient declines vaginal delivery attempt  Prenatal Course Source of Care: [facility] Pregnancy complications or risks: Previous cesarean section She plans to breastfeed She desires IUD for postpartum contraception.   Prenatal labs and studies: ABO, Rh: --/--/A POS, A POS Performed at [facility], [address] [place], [place] [address]  [id number] 1005) Antibody: NEG (07/29 1005) Rubella: Immune (02/04 0000) RPR: Non Reactive (07/29 1005)  HBsAg: Negative (02/04 0000)  HIV: Non-reactive (02/04 0000)  GBS: Positive in urine Glucola  negative Genetic screening normal Anatomy US normal  Past Medical History:  Diagnosis Date  . 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 Fibroid   . Vaginal Pap smear, abnormal     Past Surgical History:  Procedure Laterality Date  . CESAREAN SECTION    . COLPOSCOPY    . LEEP      OB History  Gravida Para Term Preterm AB Living  2 1 1     1   SAB TAB Ectopic Multiple Live Births          1    # Outcome Date GA Lbr Len/2nd Weight Sex Delivery Anes PTL Lv  2 Current           1 Term 2007 [redacted]w[redacted]d   F CS-Unspec   LIV    Social History   Socioeconomic History  . Marital status: Single    Spouse name: Not on file  . Number of children: Not on file  . Years of education: Not on file  . Highest education level: Not on file  Occupational History  . Not on file  Social Needs  . Financial resource strain: Not on file  . Food insecurity:    Worry: Not on file    Inability: Not on file  . Transportation  needs:    Medical: Not on file    Non-medical: Not on file  Tobacco Use  . Smoking status: Former Research scientist (life sciences)  . Smokeless tobacco: Never Used  . Tobacco comment: smokes hookah occasionally  Substance and Sexual Activity  . Alcohol use: Not Currently    Comment: socially   . Drug use: No  . Sexual activity: Yes    Birth control/protection: None  Lifestyle  . Physical activity:    Days per week: Not on file    Minutes per session: Not on file  . Stress: Not on file  Relationships  . Social connections:    Talks on phone: Not on file    Gets together: Not on file    Attends religious service: Not on file    Active member of club or organization: Not on file    Attends meetings of clubs or organizations: Not on file    Relationship status: Not on file  Other Topics Concern  . Not on file  Social History Narrative  . Not on file    Family History  Problem Relation Age of Onset  . Heart disease Father   . Hypercholesterolemia Father   . Heart disease Maternal Grandmother   . Cancer Maternal Grandfather   . Heart disease Paternal Grandfather  Medications Prior to Admission  Medication Sig Dispense Refill Last Dose  . Prenatal Vit-Fe Fumarate-FA (PRENATAL MULTIVITAMIN) TABS tablet Take 1 tablet by mouth daily.    03/15/2018 at Unknown time  . cyclobenzaprine (FLEXERIL) 5 MG tablet Take 1 tablet (5 mg total) by mouth 3 (three) times daily as needed for muscle spasms. (Patient not taking: Reported on 02/17/2018) 15 tablet 0 Not Taking at Unknown time  . glycopyrrolate (ROBINUL) 1 MG tablet Take 1 tablet (1 mg total) by mouth 3 (three) times daily. (Patient not taking: Reported on 03/22/2018) 90 tablet 0 Not Taking at Unknown time  . oxyCODONE-acetaminophen (PERCOCET/ROXICET) 5-325 MG tablet Take 1-2 tablets by mouth every 6 (six) hours as needed for severe pain. (Patient not taking: Reported on 02/17/2018) 6 tablet 0 Not Taking at Unknown time  . terconazole (TERAZOL 7) 0.4 % vaginal  cream Place 1 applicator vaginally at bedtime. (Patient not taking: Reported on 02/17/2018) 45 g 0 Not Taking at Unknown time   Allergies: No Known Allergies  Review of Systems: Pertinent items noted in HPI and remainder of comprehensive ROS otherwise negative.  Physical Exam: BP 119/77   Pulse (!) 110   Temp 97.7 F (36.5 C) (Oral)   Resp 18   Ht 5\' 7"  (1.702 m)   Wt 194 lb 6.4 oz (88.2 kg)   LMP 06/29/2017   BMI 30.45 kg/m  FHR by Doppler: 141 bpm CONSTITUTIONAL: Well-developed, well-nourished female in no acute distress.  HENT:  Normocephalic, atraumatic, External right and left ear normal. Oropharynx is clear and moist EYES: Conjunctivae and EOM are normal. Pupils are equal, round, and reactive to light. No scleral icterus.  NECK: Normal range of motion, supple, no masses SKIN: Skin is warm and dry. No rash noted. Not diaphoretic. No erythema. No pallor. [place]: Alert and oriented to person, place, and time. Normal reflexes, muscle tone coordination. No cranial nerve deficit noted. PSYCHIATRIC: Normal mood and affect. Normal behavior. Normal judgment and thought content. CARDIOVASCULAR: Normal heart rate noted, regular rhythm RESPIRATORY: Effort and breath sounds normal, no problems with respiration noted ABDOMEN: Soft, nontender, nondistended, gravid. Well-healed Pfannenstiel incision. PELVIC: Deferred MUSCULOSKELETAL: Normal range of motion. No edema and no tenderness. 2+ distal pulses.   Pertinent Labs/Studies:   Results for orders placed or performed during the hospital encounter of 03/28/18 (from the past 72 hour(s))  CBC     Status: Abnormal   Collection Time: 03/28/18 10:05 AM  Result Value Ref Range   WBC 6.3 4.0 - 10.5 K/uL   RBC 3.94 3.87 - 5.11 MIL/uL   Hemoglobin 11.6 (L) 12.0 - 15.0 g/dL   HCT 35.3 (L) 36.0 - 46.0 %   MCV 89.6 78.0 - 100.0 fL   MCH 29.4 26.0 - 34.0 pg   MCHC 32.9 30.0 - 36.0 g/dL   RDW 13.8 11.5 - 15.5 %   Platelets 232 150 - 400 K/uL     Comment: Performed at [facility], [address] [place], [place] [address]  RPR     Status: None   Collection Time: 03/28/18 10:05 AM  Result Value Ref Range   RPR Ser Ql Non Reactive Non Reactive    Comment: (NOTE) Performed At: [facility] [place], [place] [id number] Rush Farmer MD TO:[id number]   Type and screen [place]     Status: None   Collection Time: 03/28/18 10:05 AM  Result Value Ref Range   ABO/RH(D) A POS    Antibody Screen  NEG    Sample Expiration      03/31/2018 Performed at [facility], [address], [place], [place] [address]   ABO/Rh     Status: None   Collection Time: 03/28/18 10:05 AM  Result Value Ref Range   ABO/RH(D)      A POS Performed at [facility], [address] [place], [place] [address]     Assessment and Plan :Tracie Walsh is a 32 y.o. G2P1001 at [redacted]w[redacted]d being admitted for scheduled repeat cesarean section. The risks of cesarean section discussed with the patient included but were not limited to: bleeding which may require transfusion or reoperation; infection which may require antibiotics; injury to bowel, bladder, ureters or other surrounding organs; injury to the fetus; need for additional procedures including hysterectomy in the event of a life-threatening hemorrhage; placental abnormalities wth subsequent pregnancies, incisional problems, thromboembolic phenomenon and other postoperative/anesthesia complications. The patient concurred with the proposed plan, giving informed written consent for the procedure. Patient has been NPO since last night she will remain NPO for procedure. Anesthesia and OR aware. Preoperative prophylactic antibiotics and SCDs ordered on call to the OR. To OR when ready.     Verita Schneiders, MD, [place] for [employer], [place]

## 2018-03-29 NOTE — Anesthesia Postprocedure Evaluation (Signed)
Anesthesia Post Note  Patient: Tracie Walsh  Procedure(s) Performed: REPEAT CESAREAN SECTION (N/A Abdomen)     Patient location during evaluation: PACU Anesthesia Type: Spinal Level of consciousness: awake Pain management: pain level controlled Vital Signs Assessment: post-procedure vital signs reviewed and stable Respiratory status: spontaneous breathing Cardiovascular status: stable Postop Assessment: no headache, no backache, spinal receding, no apparent nausea or vomiting and patient able to bend at knees Anesthetic complications: no    Last Vitals:  Vitals:   03/29/18 1630 03/29/18 1700  BP: (!) 104/91 118/87  Pulse: 89 86  Resp: 17 19  Temp:    SpO2: 97% 100%    Last Pain:  Vitals:   03/29/18 1622  TempSrc: Oral  PainSc:    Pain Goal:                 Tristram Milian JR,JOHN [place] Scheiderer

## 2018-03-30 ENCOUNTER — Encounter (HOSPITAL_COMMUNITY): Payer: Self-pay | Admitting: Family Medicine

## 2018-03-30 LAB — CBC
HEMATOCRIT: 32.3 % — AB (ref 36.0–46.0)
Hemoglobin: 10.9 g/dL — ABNORMAL LOW (ref 12.0–15.0)
MCH: 29.9 pg (ref 26.0–34.0)
MCHC: 33.7 g/dL (ref 30.0–36.0)
MCV: 88.7 fL (ref 78.0–100.0)
Platelets: 234 10*3/uL (ref 150–400)
RBC: 3.64 MIL/uL — AB (ref 3.87–5.11)
RDW: 13.7 % (ref 11.5–15.5)
WBC: 9.6 10*3/uL (ref 4.0–10.5)

## 2018-03-30 NOTE — Progress Notes (Addendum)
POSTPARTUM PROGRESS NOTE  Post Operative Day 1 Subjective:  Tracie Walsh is a 32 y.o. G2P1001 [redacted]w[redacted]d s/p ERLTCS.  No acute events overnight.  Pt denies problems with ambulating. Catheter was just removed so has not voided on her own. Has also not ate yet but states that she is hungry. She denies nausea or vomiting.  Pain is well controlled. She has not had bowel movement.  Lochia Small. Pt endorses itching and trouble sleeping. Also wants to know when she can bind herself again.   Objective: Blood pressure 103/75, pulse 93, temperature 98 F (36.7 C), temperature source Oral, resp. rate 18, height 5\' 7"  (1.702 m), weight 88.2 kg (194 lb 6.4 oz), last menstrual period 06/29/2017, SpO2 99 %.  Physical Exam:  General: alert, cooperative and no distress Lochia:normal flow Chest: no respiratory distress Heart:regular rate, distal pulses intact Incision: Still covered by pressure dressing, unable to visualize Abdomen: soft, nontender,  Uterine Fundus: firm, appropriately tender DVT Evaluation: No calf swelling or tenderness Extremities: No edema  Recent Labs    03/28/18 1005 03/30/18 0524  HGB 11.6* 10.9*  HCT 35.3* 32.3*    Assessment/Plan:  ASSESSMENT: Tracie Walsh is a 32 y.o. G2P1001 [redacted]w[redacted]d s/p ERLTCS.  Plan for discharge tomorrow, Breastfeeding and Contraception IUD, Nubain and ambien orders already placed for itching and trouble sleeping at patients request. May return to binding 2 days after pressure dressing is removed.    LOS: 1 day   Sherene Sires MS3 03/30/2018, 7:52 AM   OB FELLOW MEDICAL STUDENT NOTE ATTESTATION I confirm that I have verified the information documented in the medical student's note and that I have also personally performed the physical exam and all medical decision making activities.  Patient doing well POD#1. Pain well controlled.  VS wnl Hgb appropriate  A/P: POD#1: OOB, ambulate, remove pressure dressing.  VTE  prophylaxis: Lovenox Contraception: IUD Dispo: likely d/c home tomorrow   Gailen Shelter, MD OB Fellow 03/30/2018, 1:19 PM

## 2018-03-30 NOTE — Plan of Care (Signed)
  Problem: Pain Managment: Goal: General experience of comfort will improve Note:  Used Viria, in house interpreter, to discuss pain medication options. At this time, patient requests only tylenol and ibuprofen for her pain score of 5 out of 10. Patient also reporting not passing gas yet. Discussed the importance of ambulating in hallway, in which she began to do, and increasing fluid intake.

## 2018-03-30 NOTE — Anesthesia Postprocedure Evaluation (Signed)
Anesthesia Post Note  Patient: Tracie Walsh  Procedure(s) Performed: REPEAT CESAREAN SECTION (N/A Abdomen)     Patient location during evaluation: Mother Baby Anesthesia Type: Spinal Level of consciousness: awake and alert Pain management: pain level controlled Vital Signs Assessment: post-procedure vital signs reviewed and stable Respiratory status: spontaneous breathing Cardiovascular status: stable Postop Assessment: no headache, patient able to bend at knees, no backache, no apparent nausea or vomiting, adequate PO intake, spinal receding and able to ambulate Anesthetic complications: no    Last Vitals:  Vitals:   03/30/18 0100 03/30/18 0540  BP:  103/75  Pulse:  93  Resp: 18 18  Temp: 36.6 C 36.7 C  SpO2: 99% 99%    Last Pain:  Vitals:   03/30/18 0540  TempSrc: Oral  PainSc: 0-No pain   Pain Goal:                 Ailene Ards

## 2018-03-31 ENCOUNTER — Encounter (HOSPITAL_COMMUNITY): Payer: Self-pay | Admitting: *Deleted

## 2018-03-31 DIAGNOSIS — D259 Leiomyoma of uterus, unspecified: Secondary | ICD-10-CM

## 2018-03-31 DIAGNOSIS — Z3A39 39 weeks gestation of pregnancy: Secondary | ICD-10-CM

## 2018-03-31 DIAGNOSIS — O3413 Maternal care for benign tumor of corpus uteri, third trimester: Secondary | ICD-10-CM

## 2018-03-31 DIAGNOSIS — O34211 Maternal care for low transverse scar from previous cesarean delivery: Secondary | ICD-10-CM

## 2018-03-31 LAB — BIRTH TISSUE RECOVERY COLLECTION (PLACENTA DONATION)

## 2018-03-31 MED ORDER — OXYCODONE-ACETAMINOPHEN 5-325 MG PO TABS: 1.0000 | ORAL_TABLET | ORAL | 0 refills | Status: AC | PRN

## 2018-03-31 NOTE — Discharge Summary (Signed)
OB Discharge Summary     Patient Name: Tracie Walsh DOB: May 16, 1986 MRN: [id number]  Date of admission: 03/29/2018 Delivering MD: Truett Mainland   Date of discharge: 03/31/2018  Admitting diagnosis: RCS Intrauterine pregnancy: [redacted]w[redacted]d     Secondary diagnosis:  Active Problems:   Status post cesarean section  Additional problems: none     Discharge diagnosis: Term Pregnancy Delivered                                                                                                Post partum procedures:none  Augmentation: none  Complications: None  Hospital course:  Sceduled C/S   32 y.o. yo G2P1001 at [redacted]w[redacted]d was admitted to the hospital 03/29/2018 for scheduled cesarean section with the following indication:Elective Repeat.  Membrane Rupture Time/Date: 3:10 PM ,03/29/2018   Patient delivered a Viable infant.03/29/2018  Details of operation can be found in separate operative note.  Pateint had an uncomplicated postpartum course.  She is ambulating, tolerating a regular diet, passing flatus, and urinating well. Patient is discharged home in stable condition on  03/31/18         Physical exam  Vitals:   03/30/18 0800 03/30/18 1518 03/30/18 2233 03/31/18 0632  BP: 114/89 114/80 107/75 108/83  Pulse: 90 100 (!) 107 92  Resp:  18 18 18   Temp: 98 F (36.7 C) 97.7 F (36.5 C) 97.9 F (36.6 C) 97.8 F (36.6 C)  TempSrc: Oral Oral Oral Oral  SpO2:  100% 99%   Weight:      Height:       General: alert, cooperative and no distress Lochia: appropriate Uterine Fundus: firm Incision: Healing well with no significant drainage, Dressing is clean, dry, and intact DVT Evaluation: No evidence of DVT seen on physical exam. Negative Homan's sign. No significant calf/ankle edema. Labs: Lab Results  Component Value Date   WBC 9.6 03/30/2018   HGB 10.9 (L) 03/30/2018   HCT 32.3 (L) 03/30/2018   MCV 88.7 03/30/2018   PLT 234 03/30/2018   CMP Latest Ref Rng & Units 03/29/2018   Creatinine 0.44 - 1.00 mg/dL 0.50    Discharge instruction: per After Visit Summary and "Baby and Me Booklet".  After visit meds:  Allergies as of 03/31/2018   No Known Allergies     Medication List    STOP taking these medications   glycopyrrolate 1 MG tablet Commonly known as:  ROBINUL   terconazole 0.4 % vaginal cream Commonly known as:  TERAZOL 7     TAKE these medications   cyclobenzaprine 5 MG tablet Commonly known as:  FLEXERIL Take 1 tablet (5 mg total) by mouth 3 (three) times daily as needed for muscle spasms.   oxyCODONE-acetaminophen 5-325 MG tablet Commonly known as:  PERCOCET/ROXICET Take 1-2 tablets by mouth every 6 (six) hours as needed for severe pain. What changed:  Another medication with the same name was added. Make sure you understand how and when to take each.   oxyCODONE-acetaminophen 5-325 MG tablet Commonly known as:  PERCOCET/ROXICET Take 1 tablet by mouth every 4 (four) hours  as needed for up to 5 days (pain scale 4-7). What changed:  You were already taking a medication with the same name, and this prescription was added. Make sure you understand how and when to take each.   prenatal multivitamin Tabs tablet Take 1 tablet by mouth daily.       Diet: routine diet  Activity: Advance as tolerated. Pelvic rest for 6 weeks.   Outpatient follow up:1 week for incision check and 4 weeks for postpartum appointment Follow up Appt:No future appointments. Follow up Visit:No follow-ups on file.  Postpartum contraception: IUD Mirena  Newborn Data: Live born female  Birth Weight: 7 lb 10.8 oz (3480 g) APGAR: 9, 9  Newborn Delivery   Birth date/time:  03/29/2018 15:11:00 Delivery type:  C-Section, Low Transverse Trial of labor:  No C-section categorization:  Repeat     Baby Feeding: Breast Disposition:home with mother   03/31/2018 Lajean Manes, CNM

## 2018-03-31 NOTE — Progress Notes (Signed)
POSTPARTUM PROGRESS NOTE  Post Operative Day 2 Subjective:  Tracie Walsh is a 32 y.o. G2P1001 [redacted]w[redacted]d s/p ERLTCS.  No acute events overnight.  Pt denies problems with ambulating, voiding or po intake.  She denies nausea or vomiting.  Pain is moderately controlled.  She has had flatus. She has not had bowel movement.  Lochia Minimal.   Objective: Blood pressure 108/83, pulse 92, temperature 97.8 F (36.6 C), temperature source Oral, resp. rate 18, height 5\' 7"  (1.702 m), weight 88.2 kg (194 lb 6.4 oz), last menstrual period 06/29/2017, SpO2 99 %.  Physical Exam:  General: alert, cooperative and no distress Lochia:normal flow Chest: no respiratory distress Heart:regular rate, distal pulses intact Incision: Clean and dry Abdomen: soft, nontender,  Uterine Fundus: firm but very tender to palpation DVT Evaluation: No calf swelling or tenderness Extremities: No peripheral edema  Recent Labs    03/28/18 1005 03/30/18 0524  HGB 11.6* 10.9*  HCT 35.3* 32.3*    Assessment/Plan:  ASSESSMENT: Tracie Walsh is a 32 y.o. G2P1001 [redacted]w[redacted]d s/p ERLTCS.   Plan for discharge tomorrow, Breastfeeding and Contraception IUD  VTE Prophylaxis: Lovenox   LOS: 2 days   Sherene Sires MS3 03/31/2018, 7:38 AM

## 2018-04-01 ENCOUNTER — Ambulatory Visit: Payer: Self-pay

## 2018-04-01 NOTE — Lactation Note (Signed)
This note was copied from a baby's chart. Lactation Consultation Note  Patient Name: Tracie Walsh [id number] Date: 04/01/2018 Reason for consult: Follow-up assessment;Nipple pain/trauma Baby is currently latched well to breast.  Mom c/o nipple soreness.  Nipples intact.  Comfort gels given with instructions.  Breasts are filling.  Manual pump given with instructions for prn use at home.  Maternal Data    Feeding    LATCH Score Latch: (instructed mob to call with feeding, never called)                 Interventions    Lactation Tools Discussed/Used Tools: Comfort gels Pump Review: Setup, frequency, and cleaning;Milk Storage Initiated by:: LM Date initiated:: 04/01/18   Consult Status Consult Status: Complete Follow-up type: Call as needed    [name] 04/01/2018, 9:24 AM

## 2018-04-13 ENCOUNTER — Telehealth (HOSPITAL_COMMUNITY): Payer: Self-pay | Admitting: *Deleted

## 2018-04-13 NOTE — Telephone Encounter (Signed)
Called patient with Spanish interpreter Tracie Walsh to remind her that her Pap smear is due in October 2019. Told patient about the free cervical cancer screening June 13, 2018. Patient stated she will either come to the free cervical cancer screening or the health department to have completed. Patient verbalized understanding.

## 2019-03-16 IMAGING — US US MFM OB DETAIL+14 WK
1 series · 14 of 28 positions shown · non-contrast
Comparison: none

[Series 1: us mfm ob detail+14 wk · 78 acquisitions, 14 frames shown]
[im 3/78]
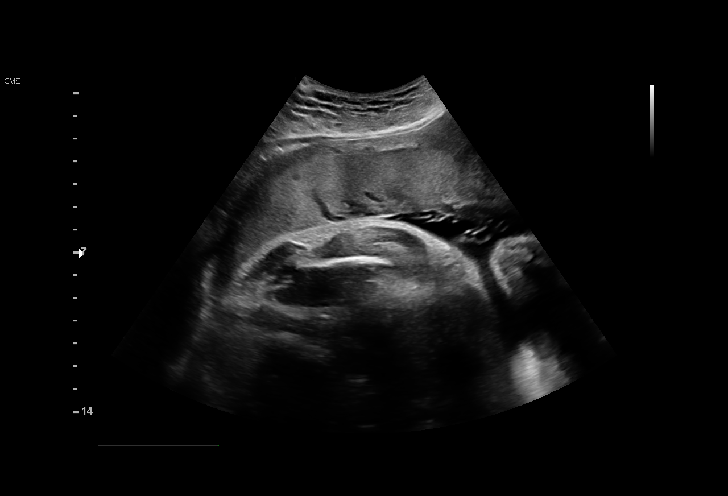
[im 9/78]
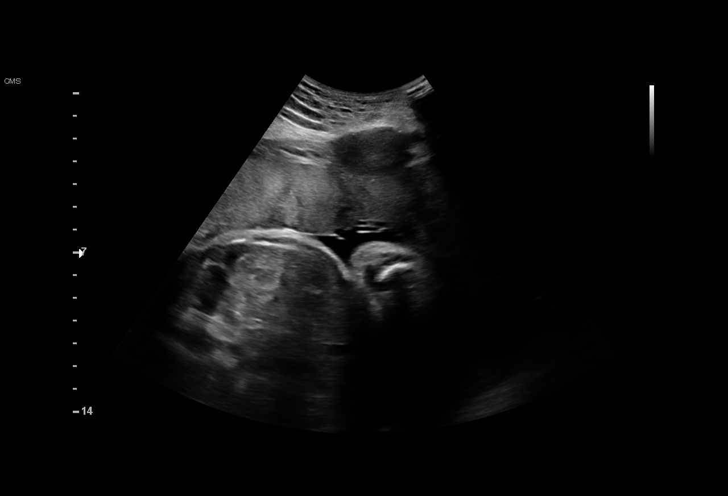
[im 15/78]
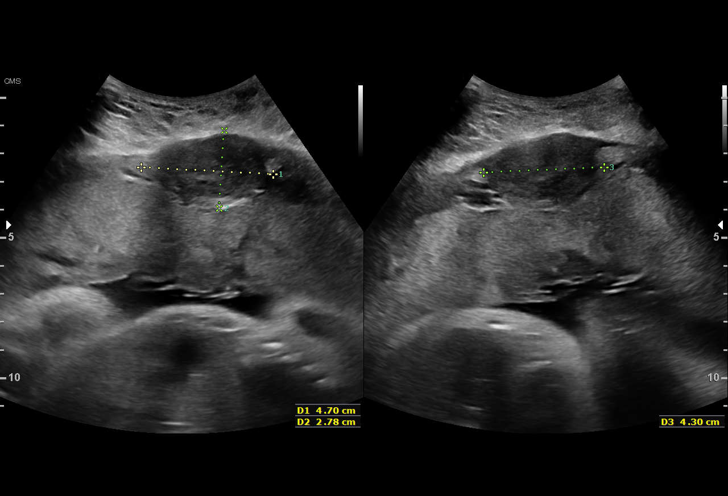
[im 20/78]
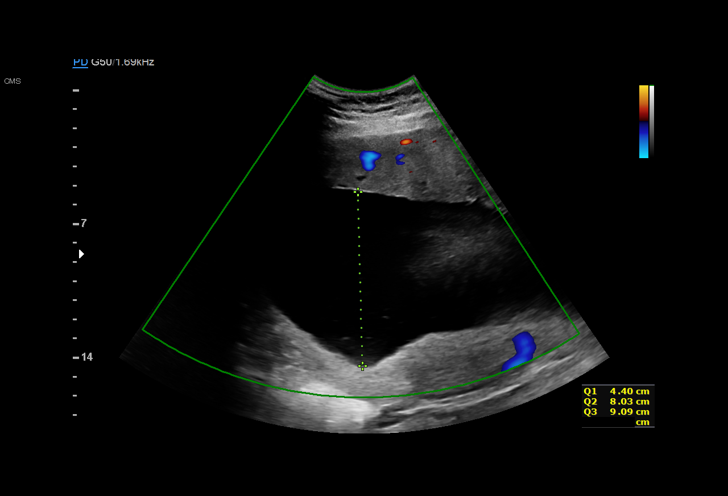
[im 26/78]
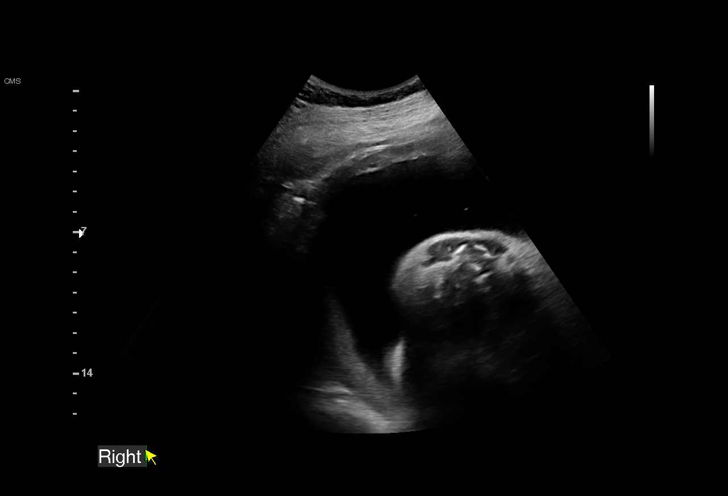
[im 32/78]
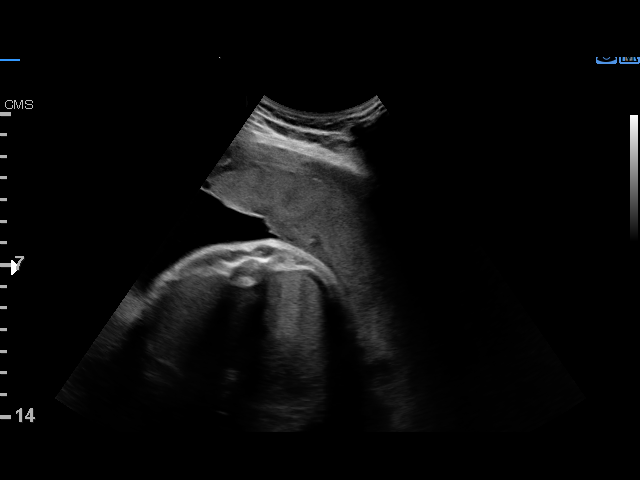
[im 38/78]
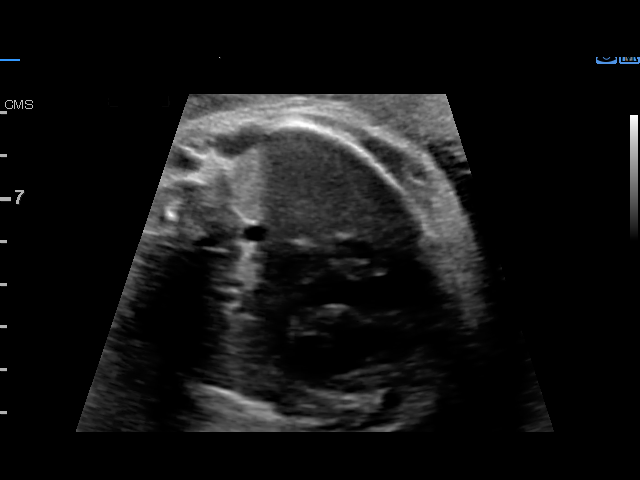
[im 43/78]
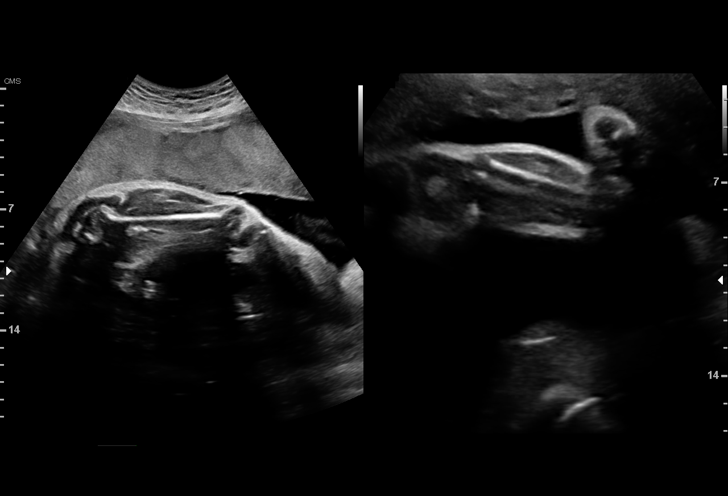
[im 49/78]
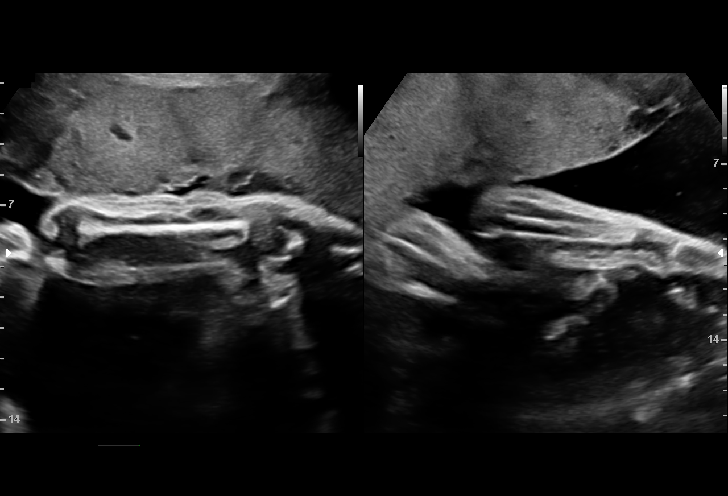
[im 55/78]
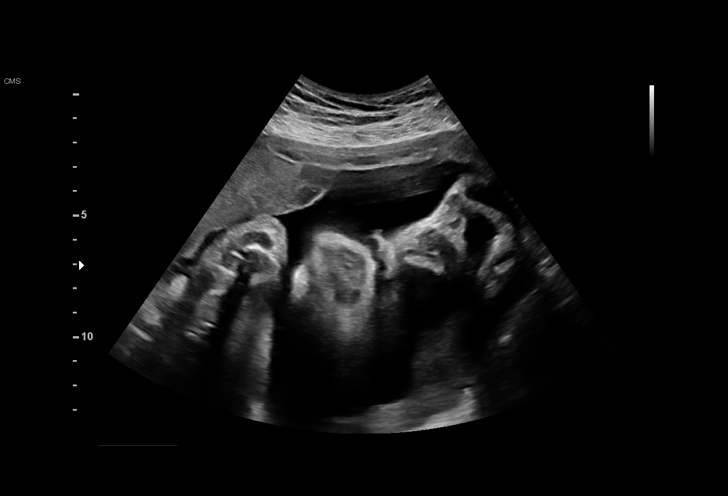
[im 60/78]
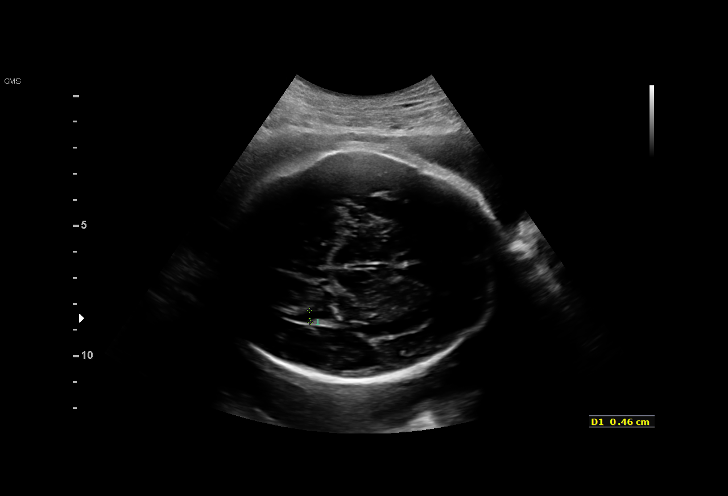
[im 66/78]
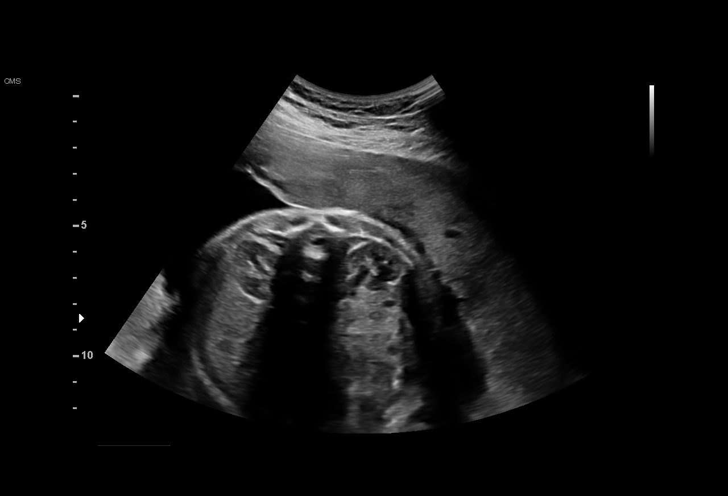
[im 72/78]
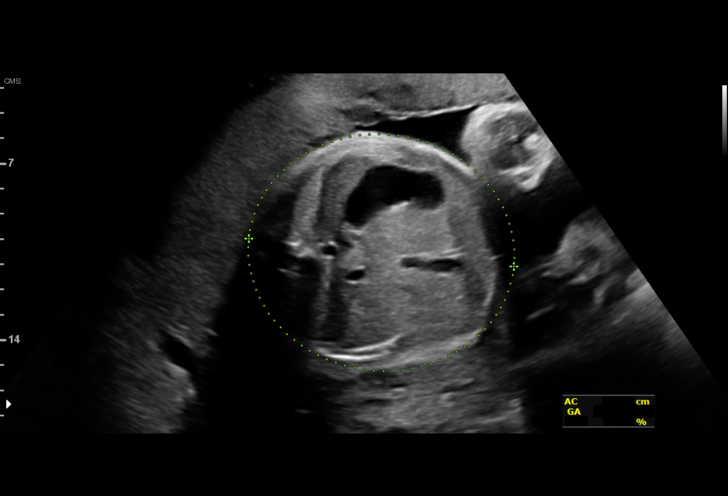
[im 78/78]
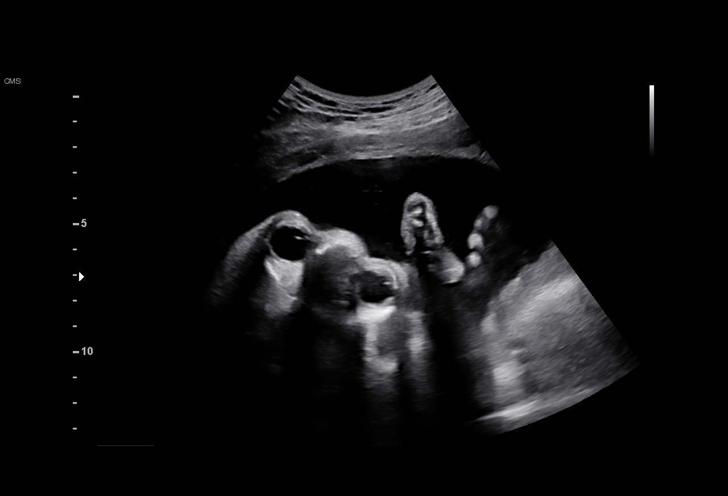

[14 of 28 positions shown; findings below may reference images not displayed]

[REDACTED]-
Faculty Physician

1  HLANGANANI DINABANTU              [id number]      [phone]     [id number]
Indications

33 weeks gestation of pregnancy
Encounter for antenatal screening for
malformations
Uterine fibroids affecting pregnancy in third  O34.13,
trimester, antepartum
Previous cesarean delivery, antepartum
Polyhydramnios, third trimester, antepartum
condition or complication, unspecified fetus
OB History

Gravidity:    2         Term:   1        Prem:   0         SAB:   0
TOP:          0       Ectopic:  0        Living: 1
Fetal Evaluation

Num Of Fetuses:     1
Fetal Heart         145
Rate(bpm):
Cardiac Activity:   Observed
Presentation:       Cephalic
Placenta:           Anterior, above cervical os
P. Cord Insertion:  Visualized

Amniotic Fluid
AFI FV:      Mild Polyhydramnios

AFI Sum(cm)     %Tile       Largest Pocket(cm)
26.22           96

RUQ(cm)       RLQ(cm)       LUQ(cm)        LLQ(cm)
4.4
Biometry

BPD:      88.2  mm     G. Age:  35w 5d         95  %    CI:         72.99  %    70 - 86
FL/HC:       19.7  %    19.9 -
HC:      328.2  mm     G. Age:  37w 2d         96  %    HC/AC:       1.05       0.96 -
AC:      313.1  mm     G. Age:  35w 1d         93  %    FL/BPD:      73.5  %    71 - 87
FL:       64.8  mm     G. Age:  33w 3d         43  %    FL/AC:       20.7  %    20 - 24
HUM:      56.7  mm     G. Age:  33w 0d         51  %

Est. FW:    5276   gm    5 lb 11 oz     83  %
Gestational Age

LMP:           33w 2d        Date:  06/29/17                 EDD:    04/05/18
U/S Today:     35w 3d                                        EDD:    03/21/18
Best:          33w 2d     Det. By:  LMP  (06/29/17)          EDD:    04/05/18
Anatomy

Cranium:               Appears normal         Aortic Arch:            Appears normal
Cavum:                 Appears normal         Ductal Arch:            Appears normal
Ventricles:            Appears normal         Diaphragm:              Appears normal
Choroid Plexus:        Appears normal         Stomach:                Appears normal, left
sided
Cerebellum:            Appears normal         Abdomen:                Appears normal
Posterior Fossa:       Appears normal         Abdominal Wall:         Appears nml (cord
insert, abd wall)
Nuchal Fold:           Not applicable (>20    Cord Vessels:           Appears normal (3
wks GA)                                        vessel cord)
Face:                  Appears normal         Kidneys:                Appear normal
(orbits and profile)
Lips:                  Appears normal         Bladder:                Appears normal
Thoracic:              Appears normal         Spine:                  Appears normal
Heart:                 Appears normal         Upper Extremities:      Appears normal
(4CH, axis, and
situs)
RVOT:                  Appears normal         Lower Extremities:      Appears normal
LVOT:                  Appears normal

Other:  Fetus appears to be a male. Open hands visualized. Nasal bone
visualized.
Cervix Uterus Adnexa

Cervix
Not visualized (advanced GA >35wks)

Uterus
Multiple fibroids noted, see table below.

Left Ovary
Within normal limits.

Right Ovary
Not visualized.

Cul De Sac:   No free fluid seen.

Adnexa:       No abnormality visualized.
Myomas

Site                     L(cm)      W(cm)      D(cm)       Location
Left
Anterior
Posterior Fundal

Blood Flow                 RI        PI       Comments

Impression

SIUP at 33+2 weeks
Normal detailed fetal anatomy
Mild polyhydramnios
Measurements consistent with LMP dating; EFW at the 83rd
%tile
Fibroid uterus: see above for size and location
Recommendations

No contraindications to a trial of labor; pt indicated to me that
she desires a repeat C/S
Follow-up as clinically indicated

## 2023-01-22 ENCOUNTER — Emergency Department (HOSPITAL_COMMUNITY): Payer: Self-pay

## 2023-01-22 ENCOUNTER — Emergency Department (HOSPITAL_COMMUNITY)
Admission: EM | Admit: 2023-01-22 | Discharge: 2023-01-22 | Disposition: A | Discharge status: Home / Self Care | Payer: Self-pay | Attending: Emergency Medicine | Admitting: Emergency Medicine

## 2023-01-22 ENCOUNTER — Other Ambulatory Visit: Payer: Self-pay

## 2023-01-22 ENCOUNTER — Encounter (HOSPITAL_COMMUNITY): Payer: Self-pay

## 2023-01-22 DIAGNOSIS — R002 Palpitations: Secondary | ICD-10-CM | POA: Insufficient documentation

## 2023-01-22 DIAGNOSIS — E236 Other disorders of pituitary gland: Secondary | ICD-10-CM

## 2023-01-22 DIAGNOSIS — G43809 Other migraine, not intractable, without status migrainosus: Secondary | ICD-10-CM | POA: Insufficient documentation

## 2023-01-22 MED ORDER — DIPHENHYDRAMINE HCL 50 MG/ML IJ SOLN
25.0000 mg | Freq: Once | INTRAMUSCULAR | Status: AC
  Administered 2023-01-22: 25 mg via INTRAVENOUS
  Filled 2023-01-22: qty 1

## 2023-01-22 MED ORDER — PROCHLORPERAZINE EDISYLATE 10 MG/2ML IJ SOLN
10.0000 mg | Freq: Once | INTRAMUSCULAR | Status: AC
  Administered 2023-01-22: 10 mg via INTRAVENOUS
  Filled 2023-01-22: qty 2

## 2023-01-22 NOTE — ED Triage Notes (Signed)
Patient reports she woke up with severe headache and chest pain on the right side. Patient also endorses pain in her left eye when open and nausea.

## 2023-01-22 NOTE — ED Provider Notes (Signed)
[place] EMERGENCY DEPARTMENT AT [facility] Provider Note   CSN: [id number] Arrival date & time: 01/22/23  0251     History  Chief Complaint  Patient presents with   Headache   Chest Pain    WYNN WINDERS is a 37 y.o. female.  HPI     This is a 37 year old female who presents with headache and palpitations.  Patient reports that she woke up with severe.  She reports.  She denies any history of prior headaches or migraines but states that she had a bad headache Friday.  Had some nausea.  No vomiting.  No vision changes or vision loss.  Denies weakness, numbness, tingling.  Denies fevers.  Patient reports associated palpitations.  No chest pain or shortness of breath.  Initially history was taken with the help of the family member.  Subsequently an interpreter was used to confirm history.  Home Medications Prior to Admission medications   Medication Sig Start Date End Date Taking? Authorizing Provider  cyclobenzaprine (FLEXERIL) 5 MG tablet Take 1 tablet (5 mg total) by mouth 3 (three) times daily as needed for muscle spasms. Patient not taking: Reported on 02/17/2018 10/08/17   Rolm Bookbinder, CNM  oxyCODONE-acetaminophen (PERCOCET/ROXICET) 5-325 MG tablet Take 1-2 tablets by mouth every 6 (six) hours as needed for severe pain. Patient not taking: Reported on 02/17/2018 11/03/17   Hurshel Party, CNM  Prenatal Vit-Fe Fumarate-FA (PRENATAL MULTIVITAMIN) TABS tablet Take 1 tablet by mouth daily.     [provider]      Allergies    Patient has no known allergies.    Review of Systems   Review of Systems  Constitutional:  Negative for fever.  Eyes:  Positive for photophobia. Negative for visual disturbance.  Neurological:  Positive for headaches. Negative for weakness and numbness.  All other systems reviewed and are negative.   Physical Exam Updated Vital Signs BP (!) 121/99 (BP Location: Left Arm)   Pulse 88   Temp 98.5 F (36.9  C) (Oral)   Resp 18   Ht 1.702 m (5\' 7" )   Wt 78.5 kg   SpO2 100%   BMI 27.10 kg/m  Physical Exam Vitals and nursing note reviewed.  Constitutional:      Appearance: She is well-developed.  HENT:     Head: Normocephalic and atraumatic.  Eyes:     Extraocular Movements: Extraocular movements intact.     Pupils: Pupils are equal, round, and reactive to light.  Cardiovascular:     Rate and Rhythm: Normal rate and regular rhythm.     Heart sounds: Normal heart sounds.  Pulmonary:     Effort: Pulmonary effort is normal. No respiratory distress.     Breath sounds: No wheezing.  Abdominal:     General: Bowel sounds are normal.     Palpations: Abdomen is soft.  Musculoskeletal:     Cervical back: Neck supple.  Skin:    General: Skin is warm and dry.  Neurological:     Mental Status: She is alert and oriented to person, place, and time.     Comments: Cranial nerves II through XII intact, 5 out of 5 strength in all 4 extremities, no dysmetria to finger-nose-finger, visual fields intact     ED Results / Procedures / Treatments   Labs (all labs ordered are listed, but only abnormal results are displayed) Labs Reviewed - No data to display  EKG EKG Interpretation  Date/Time:  Friday Jan 22 2023  04:12:20 EDT Ventricular Rate:  93 PR Interval:  207 QRS Duration: 79 QT Interval:  376 QTC Calculation: 468 R Axis:   44 Text Interpretation: Sinus or ectopic atrial rhythm Prolonged PR interval Anterior infarct, old Confirmed by Ross Marcus ([id number]) on 01/22/2023 5:11:08 AM  Radiology CT Head Wo Contrast  Result Date: 01/22/2023 CLINICAL DATA:  37 year old female with severe headache. Left eye pain on waking. EXAM: CT HEAD WITHOUT CONTRAST TECHNIQUE: Contiguous axial images were obtained from the base of the skull through the vertex without intravenous contrast. RADIATION DOSE REDUCTION: This exam was performed according to the departmental dose-optimization program which  includes automated exposure control, adjustment of the mA and/or kV according to patient size and/or use of iterative reconstruction technique. COMPARISON:  None Available. FINDINGS: Brain: Partially empty sella (series 5, image 36). Normal background brain volume. No midline shift, ventriculomegaly, mass effect, evidence of mass lesion, intracranial hemorrhage or evidence of cortically based acute infarction. Gray-white matter differentiation is within normal limits throughout the brain. No encephalomalacia identified. Vascular: No suspicious intracranial vascular hyperdensity. Skull: Negative. Sinuses/Orbits: Minimal left ethmoid sinus mucosal thickening and opacification. Other Visualized paranasal sinuses and mastoids are well aerated. Other: Visualized orbits and scalp soft tissues are within normal limits. IMPRESSION: 1. Partially empty sella, often a normal anatomic variant but can be associated with idiopathic intracranial hypertension (pseudotumor cerebri). 2. Otherwise normal noncontrast Head CT. Electronically Signed   By: Odessa Fleming M.D.   On: 01/22/2023 04:00    Procedures Procedures    Medications Ordered in ED Medications  prochlorperazine (COMPAZINE) injection 10 mg (10 mg Intravenous Given 01/22/23 0406)  diphenhydrAMINE (BENADRYL) injection 25 mg (25 mg Intravenous Given 01/22/23 0406)    ED Course/ Medical Decision Making/ A&P                             Medical Decision Making Amount and/or Complexity of Data Reviewed Radiology: ordered.  Risk Prescription drug management.   This patient presents to the ED for concern of headache, palpitations, this involves an extensive number of treatment options, and is a complaint that carries with it a high risk of complications and morbidity.  I considered the following differential and admission for this acute, potentially life threatening condition.  The differential diagnosis includes new onset migraines, tension headache,  MDM:     This is a 37 year old female who presents with headache primarily.  She also endorses about she is nontoxic and vital signs are reassuring.  EKG shows no evidence of acute arrhythmia or ischemia.  Neurologic exam is normal.  CT head obtained and shows no evidence of acute bleed.  Given timing, this effectively rules out subarachnoid hemorrhage.  She does incidentally have a empty sella which can be associated with intracranial hypertension.  On recheck, she had complete resolution of her symptoms with migraine cocktail.  She had no peripheral vision field defects or neurologic deficits.  Would favor migraine given description of pain over idiopathic intracranial hypertension.  However, will have her follow-up with neurology.  (Labs, imaging, consults)  Labs: I Ordered, and personally interpreted labs.  The pertinent results include: None  Imaging Studies ordered: I ordered imaging studies including CT head I independently visualized and interpreted imaging. I agree with the radiologist interpretation  Additional history obtained from family at bedside.  External records from outside source obtained and reviewed including prior evaluations  Cardiac Monitoring: The patient was maintained on a cardiac monitor.  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   Fibroid    Vaginal Pap smear, abnormal      Medicines Meds ordered this encounter  Medications   prochlorperazine (COMPAZINE) injection 10 mg   diphenhydrAMINE (BENADRYL) injection 25 mg    I have  reviewed the patients home medicines and have made adjustments as needed  Problem List / ED Course: Problem List Items Addressed This Visit   None Visit Diagnoses     Other migraine without status migrainosus, not intractable    -  Primary   Palpitations                       Final Clinical Impression(s) / ED Diagnoses Final diagnoses:  Other migraine without status migrainosus, not intractable  Palpitations    Rx / DC Orders ED Discharge Orders          Ordered    Ambulatory referral to Neurology       Comments: An appointment is requested in approximately: 2 weeks   01/22/23 0543              Shon Baton, MD 01/22/23 [phone]

## 2023-01-22 NOTE — Discharge Instructions (Addendum)
You were seen today for headache.  Following with neurology.  Your CT scan was largely reassuring.  You do have an empty sella.  This can be a normal condition.
